# Patient Record
Sex: Female | Born: 1985 | Race: Black or African American | Hispanic: No | Marital: Single | Smoking: Never smoker
Health system: Southern US, Community
[De-identification: ages and names within clinical notes are randomized; demographics above are authoritative.]

## PROBLEM LIST (undated history)

## (undated) DIAGNOSIS — D573 Sickle-cell trait: Secondary | ICD-10-CM

## (undated) DIAGNOSIS — E282 Polycystic ovarian syndrome: Secondary | ICD-10-CM

## (undated) DIAGNOSIS — E559 Vitamin D deficiency, unspecified: Secondary | ICD-10-CM

## (undated) DIAGNOSIS — N883 Incompetence of cervix uteri: Secondary | ICD-10-CM

## (undated) DIAGNOSIS — E669 Obesity, unspecified: Secondary | ICD-10-CM

## (undated) DIAGNOSIS — E079 Disorder of thyroid, unspecified: Secondary | ICD-10-CM

## (undated) HISTORY — DX: Sickle-cell trait: D57.3

## (undated) HISTORY — DX: Obesity, unspecified: E66.9

## (undated) HISTORY — DX: Vitamin D deficiency, unspecified: E55.9

## (undated) HISTORY — PX: WISDOM TOOTH EXTRACTION: SHX21

## (undated) HISTORY — DX: Polycystic ovarian syndrome: E28.2

---

## 2008-04-09 ENCOUNTER — Emergency Department (HOSPITAL_COMMUNITY): Admission: EM | Admit: 2008-04-09 | Discharge: 2008-04-09 | Payer: Self-pay | Admitting: Emergency Medicine

## 2008-09-08 ENCOUNTER — Emergency Department (HOSPITAL_COMMUNITY): Admission: EM | Admit: 2008-09-08 | Discharge: 2008-09-08 | Payer: Self-pay | Admitting: Emergency Medicine

## 2010-05-30 LAB — GLUCOSE, CAPILLARY: Glucose-Capillary: 85 mg/dL (ref 70–99)

## 2011-02-18 ENCOUNTER — Emergency Department (HOSPITAL_COMMUNITY)
Admission: EM | Admit: 2011-02-18 | Discharge: 2011-02-19 | Discharge status: Against Medical Advice | Payer: PRIVATE HEALTH INSURANCE | Attending: Emergency Medicine | Admitting: Emergency Medicine

## 2011-02-18 ENCOUNTER — Encounter: Payer: Self-pay | Admitting: Emergency Medicine

## 2011-02-18 ENCOUNTER — Other Ambulatory Visit: Payer: Self-pay

## 2011-02-18 DIAGNOSIS — R1012 Left upper quadrant pain: Secondary | ICD-10-CM | POA: Insufficient documentation

## 2011-02-18 LAB — POCT PREGNANCY, URINE: Preg Test, Ur: NEGATIVE

## 2011-02-18 LAB — URINALYSIS, ROUTINE W REFLEX MICROSCOPIC
Nitrite: NEGATIVE
Specific Gravity, Urine: 1.023 (ref 1.005–1.030)
Urobilinogen, UA: 0.2 mg/dL (ref 0.0–1.0)

## 2011-02-18 LAB — URINE MICROSCOPIC-ADD ON

## 2011-02-18 NOTE — ED Notes (Signed)
Pt st's she has had a cough for approx 1 week,  Started having left upper quad pain 3 days ago but is better now.  Pt also c/o migraine headache

## 2012-05-24 ENCOUNTER — Encounter (HOSPITAL_COMMUNITY): Payer: Self-pay | Admitting: Obstetrics and Gynecology

## 2012-05-28 ENCOUNTER — Encounter (HOSPITAL_COMMUNITY): Payer: Self-pay

## 2012-06-06 ENCOUNTER — Encounter (HOSPITAL_COMMUNITY): Payer: Self-pay | Admitting: *Deleted

## 2012-06-06 MED ORDER — SODIUM CHLORIDE 0.9 % IV SOLN
3.0000 g | INTRAVENOUS | Status: AC
  Administered 2012-06-07: 3 g via INTRAVENOUS
  Filled 2012-06-06: qty 3

## 2012-06-06 NOTE — H&P (Signed)
Staria Birkhead Kubly is a 27 y.o. female G45P0100 presenting forprophylactic cerclage placement given a h/o a prior fetal loss at 20 weeks.  The pt had only one prenatal visit with that pregnancy so it is hard to determine why she lost the baby, but she reports LOF, cramping and delivery within hours of reaching the hospital and arrived dilated.  Given the unclear etiology we discussed options for management and have elected cervical cerclage and 17-OH progesterone treatment weekly.  She has no other issues except for a low progesterone in the first trimester that was supplemented with vaginal progesterone suppositories.  History Past OB hx Fetal loss at 20 weeks  Bay was 11oz  Past Medical History  Diagnosis Date  . Migraine   . Incompetent cervix    Past Surgical History  Procedure Laterality Date  . Wisdom tooth extraction     Family History: family history is not on file. Social History:  reports that she has never smoked. She does not have any smokeless tobacco history on file. She reports that she does not drink alcohol or use illicit drugs.    ROS    There were no vitals taken for this visit. Exam Physical Exam  Constitutional: She appears well-developed and well-nourished.  Cardiovascular: Normal rate and regular rhythm.   Respiratory: Effort normal and breath sounds normal.  GI: Soft.  Genitourinary: Vagina normal.  Uterus gravid 14 weeks  Neurological: She is alert.  Psychiatric: She has a normal mood and affect. Her behavior is normal.    Prenatal labs: ABO, Rh:  B positive Antibody:  negative  Assessment/Plan: Pt is admitted for cerclage placement for h/o fetal loss and suspicious for incompetent cervix.  She has been counseled on the risks and benefits including bleeding, possible ROM and possible fetal losss.  She desires to proceed.  Oliver Pila 06/06/2012, 7:53 AM

## 2012-06-07 ENCOUNTER — Encounter (HOSPITAL_COMMUNITY): Payer: Self-pay | Admitting: Anesthesiology

## 2012-06-07 ENCOUNTER — Ambulatory Visit (HOSPITAL_COMMUNITY)
Admission: RE | Admit: 2012-06-07 | Discharge: 2012-06-07 | Disposition: A | Discharge status: Home / Self Care | Payer: PRIVATE HEALTH INSURANCE | Source: Ambulatory Visit | Attending: Obstetrics and Gynecology | Admitting: Obstetrics and Gynecology

## 2012-06-07 ENCOUNTER — Encounter (HOSPITAL_COMMUNITY): Payer: Self-pay | Admitting: *Deleted

## 2012-06-07 ENCOUNTER — Ambulatory Visit (HOSPITAL_COMMUNITY): Payer: PRIVATE HEALTH INSURANCE | Admitting: Anesthesiology

## 2012-06-07 ENCOUNTER — Encounter (HOSPITAL_COMMUNITY)
Admission: RE | Disposition: A | Discharge status: Home / Self Care | Payer: Self-pay | Source: Ambulatory Visit | Attending: Obstetrics and Gynecology

## 2012-06-07 DIAGNOSIS — O3430 Maternal care for cervical incompetence, unspecified trimester: Secondary | ICD-10-CM | POA: Insufficient documentation

## 2012-06-07 DIAGNOSIS — N883 Incompetence of cervix uteri: Secondary | ICD-10-CM

## 2012-06-07 HISTORY — DX: Incompetence of cervix uteri: N88.3

## 2012-06-07 HISTORY — PX: CERVICAL CERCLAGE: SHX1329

## 2012-06-07 LAB — CBC
MCH: 29.5 pg (ref 26.0–34.0)
MCV: 87.8 fL (ref 78.0–100.0)
Platelets: 239 10*3/uL (ref 150–400)
RDW: 12.8 % (ref 11.5–15.5)

## 2012-06-07 SURGERY — CERCLAGE, CERVIX, VAGINAL APPROACH
Anesthesia: Spinal | Site: Cervix | Wound class: Clean Contaminated

## 2012-06-07 MED ORDER — LACTATED RINGERS IV SOLN
INTRAVENOUS | Status: DC
  Administered 2012-06-07 (×2): via INTRAVENOUS

## 2012-06-07 MED ORDER — FENTANYL CITRATE 0.05 MG/ML IJ SOLN: 25.0000 ug | INTRAMUSCULAR | Status: DC | PRN

## 2012-06-07 MED ORDER — METOCLOPRAMIDE HCL 5 MG/ML IJ SOLN: 10.0000 mg | Freq: Once | INTRAMUSCULAR | Status: DC | PRN

## 2012-06-07 MED ORDER — LACTATED RINGERS IV SOLN: INTRAVENOUS | Status: DC

## 2012-06-07 MED ORDER — MEPERIDINE HCL 25 MG/ML IJ SOLN: 6.2500 mg | INTRAMUSCULAR | Status: DC | PRN

## 2012-06-07 MED ORDER — LIDOCAINE IN DEXTROSE 5-7.5 % IV SOLN
INTRAVENOUS | Status: DC | PRN
  Administered 2012-06-07: 50 mg via INTRATHECAL

## 2012-06-07 SURGICAL SUPPLY — 15 items
CATH ROBINSON RED A/P 16FR (CATHETERS) IMPLANT
CLOTH BEACON ORANGE TIMEOUT ST (SAFETY) ×2 IMPLANT
COUNTER NEEDLE 1200 MAGNETIC (NEEDLE) IMPLANT
GLOVE BIO SURGEON STRL SZ 6.5 (GLOVE) ×2 IMPLANT
GOWN STRL REIN XL XLG (GOWN DISPOSABLE) ×4 IMPLANT
NEEDLE MAYO .5 CIRCLE (NEEDLE) ×2 IMPLANT
PACK VAGINAL MINOR WOMEN LF (CUSTOM PROCEDURE TRAY) ×2 IMPLANT
PAD OB MATERNITY 4.3X12.25 (PERSONAL CARE ITEMS) ×2 IMPLANT
PAD PREP 24X48 CUFFED NSTRL (MISCELLANEOUS) ×2 IMPLANT
SUT POLYDEK 5 CE 75 36 (SUTURE) ×4 IMPLANT
SYR BULB IRRIGATION 50ML (SYRINGE) ×2 IMPLANT
TOWEL OR 17X24 6PK STRL BLUE (TOWEL DISPOSABLE) ×4 IMPLANT
TUBING NON-CON 1/4 X 20 CONN (TUBING) ×2 IMPLANT
WATER STERILE IRR 1000ML POUR (IV SOLUTION) ×2 IMPLANT
YANKAUER SUCT BULB TIP NO VENT (SUCTIONS) ×2 IMPLANT

## 2012-06-07 NOTE — Op Note (Signed)
Operative note  Preoperative diagnosis Prior loss at [redacted] weeks gestation suggestive of incompetent cervix Intrauterine gestation at 14+ weeks  Postoperative diagnosis Same  Procedure McDonald cerclage x2 (knots at 1:00)  Surgeon Dr. Huel Cote  Anesthesia Spinal  Fluids Estimated blood loss less than 50 cc Urine output 50 cc IV fluids 1500 cc  Findings The cervix appeared long and closed  There were no specimens  Procedure After informed consent was obtained patient was taken to the operating room where spinal anesthesia was obtained without difficulty. She was prepped and draped in the normal sterile fashion in the dorsal lithotomy position. A weighted speculum was placed within the vagina after an appropriate time out was performed. The cervix was identified and 2 cerclage is placed in a pursestring fashion with #1 Polydek suture, Mayo needle. The knots were secured at 1:00 position. At the conclusion of the procedure the cervix appeared closed and there was no active bleeding. The patient was then taken to the recovery room in good condition.

## 2012-06-07 NOTE — Anesthesia Procedure Notes (Signed)
Spinal  Patient location during procedure: OR Preanesthetic Checklist Completed: patient identified, site marked, surgical consent, pre-op evaluation, timeout performed, IV checked, risks and benefits discussed and monitors and equipment checked Spinal Block Patient position: sitting Prep: DuraPrep Patient monitoring: heart rate, cardiac monitor, continuous pulse ox and blood pressure Approach: midline Location: L3-4 Injection technique: single-shot Needle Needle type: Sprotte  Needle gauge: 24 G Needle length: 9 cm Assessment Sensory level: T8 Additional Notes Spinal Dose 50mg  Xylocaine

## 2012-06-07 NOTE — Progress Notes (Signed)
Patient ID: Julie Trujillo, female   DOB: 03/05/1985, 27 y.o.   MRN: [id number] Pt states no changes in H&P.  Brief exam WNL.  Ready to proceed.

## 2012-06-07 NOTE — Anesthesia Postprocedure Evaluation (Signed)
  Anesthesia Post-op Note  Patient: Julie Trujillo  Procedure(s) Performed: Procedure(s) with comments: CERCLAGE CERVICAL (N/A) - 1 hr OR time   Patient is awake, responsive, moving her legs, and has signs of resolution of her numbness. Pain and nausea are reasonably well controlled. Vital signs are stable and clinically acceptable. Oxygen saturation is clinically acceptable. There are no apparent anesthetic complications at this time. Patient is ready for discharge.

## 2012-06-07 NOTE — Transfer of Care (Signed)
Immediate Anesthesia Transfer of Care Note  Patient: Julie Trujillo  Procedure(s) Performed: Procedure(s) with comments: CERCLAGE CERVICAL (N/A) - 1 hr OR time  Patient Location: PACU  Anesthesia Type:Spinal  Level of Consciousness: awake  Airway & Oxygen Therapy: Patient Spontanous Breathing  Post-op Assessment: Report given to PACU RN  Post vital signs: Reviewed and stable  Complications: No apparent anesthesia complications

## 2012-06-07 NOTE — Anesthesia Preprocedure Evaluation (Addendum)
Anesthesia Evaluation  Patient identified by MRN, date of birth, ID band Patient awake    Reviewed: Allergy & Precautions, H&P , NPO status , Patient's Chart, lab work & pertinent test results  Airway Mallampati: III TM Distance: >3 FB Neck ROM: Full    Dental no notable dental hx. (+) Dental Advisory Given and Teeth Intact   Pulmonary neg pulmonary ROS,  breath sounds clear to auscultation  Pulmonary exam normal       Cardiovascular negative cardio ROS  Rhythm:Regular Rate:Normal     Neuro/Psych  Headaches, negative psych ROS   GI/Hepatic negative GI ROS, Neg liver ROS,   Endo/Other  Morbid obesity  Renal/GU negative Renal ROS  negative genitourinary   Musculoskeletal negative musculoskeletal ROS (+)   Abdominal (+) + obese,   Peds negative pediatric ROS (+)  Hematology negative hematology ROS (+)   Anesthesia Other Findings   Reproductive/Obstetrics (+) Pregnancy Incompetent Cervix                          Anesthesia Physical Anesthesia Plan  ASA: III  Anesthesia Plan: Spinal   Post-op Pain Management:    Induction:   Airway Management Planned: Natural Airway  Additional Equipment:   Intra-op Plan:   Post-operative Plan:   Informed Consent: I have reviewed the patients History and Physical, chart, labs and discussed the procedure including the risks, benefits and alternatives for the proposed anesthesia with the patient or authorized representative who has indicated his/her understanding and acceptance.   Dental advisory given  Plan Discussed with: CRNA, Anesthesiologist and Surgeon  Anesthesia Plan Comments:        Anesthesia Quick Evaluation

## 2012-06-07 NOTE — Brief Op Note (Signed)
06/07/2012  8:15 AM  PATIENT:  Julie Trujillo  27 y.o. female  PRE-OPERATIVE DIAGNOSIS:  abnormal pregnancy, 59320/ incompetent cervix  POST-OPERATIVE DIAGNOSIS:  abnormal pregnancy, 16109  PROCEDURE:  Procedure(s) with comments: CERCLAGE CERVICAL (N/A) - 1 hr OR time  SURGEON:  Surgeon(s) and Role:    * Oliver Pila, MD - Primary  ANESTHESIA:   spinal  EBL:  Total I/O In: 1500 [I.V.:1500] Out: 125 [Urine:100; Blood:25]  BLOOD ADMINISTERED:none  DRAINS: none   LOCAL MEDICATIONS USED:  NONE  SPECIMEN:  No Specimen  DISPOSITION OF SPECIMEN:  N/A  COUNTS:  YES  TOURNIQUET:  * No tourniquets in log *  DICTATION: .Dragon Dictation  PLAN OF CARE: Discharge to home after PACU  PATIENT DISPOSITION:  PACU - hemodynamically stable.

## 2012-06-07 NOTE — OR Nursing (Signed)
UP ON BEDSIDE IN OR WITH FEET DANGLING DOWN DR Senaida Ores STANDING IN FRONT ON PATIENT FOR SPINAL INDUCTION

## 2012-06-08 ENCOUNTER — Encounter (HOSPITAL_COMMUNITY): Payer: Self-pay | Admitting: Obstetrics and Gynecology

## 2012-07-09 ENCOUNTER — Other Ambulatory Visit: Payer: Self-pay

## 2012-07-11 ENCOUNTER — Other Ambulatory Visit (HOSPITAL_COMMUNITY): Payer: Self-pay | Admitting: Obstetrics and Gynecology

## 2012-07-11 DIAGNOSIS — O3432 Maternal care for cervical incompetence, second trimester: Secondary | ICD-10-CM

## 2012-07-12 ENCOUNTER — Encounter (HOSPITAL_COMMUNITY): Payer: Self-pay

## 2012-07-12 ENCOUNTER — Inpatient Hospital Stay (HOSPITAL_COMMUNITY)
Admission: AD | Admit: 2012-07-12 | Discharge: 2012-07-12 | Disposition: A | Discharge status: Home / Self Care | Payer: PRIVATE HEALTH INSURANCE | Source: Ambulatory Visit | Attending: Obstetrics and Gynecology | Admitting: Obstetrics and Gynecology

## 2012-07-12 ENCOUNTER — Encounter (HOSPITAL_COMMUNITY): Payer: Self-pay | Admitting: *Deleted

## 2012-07-12 ENCOUNTER — Ambulatory Visit (HOSPITAL_COMMUNITY)
Admission: RE | Admit: 2012-07-12 | Discharge: 2012-07-12 | Disposition: A | Discharge status: Home / Self Care | Payer: PRIVATE HEALTH INSURANCE | Source: Ambulatory Visit | Attending: Obstetrics and Gynecology | Admitting: Obstetrics and Gynecology

## 2012-07-12 DIAGNOSIS — O3432 Maternal care for cervical incompetence, second trimester: Secondary | ICD-10-CM

## 2012-07-12 DIAGNOSIS — O09299 Supervision of pregnancy with other poor reproductive or obstetric history, unspecified trimester: Secondary | ICD-10-CM | POA: Insufficient documentation

## 2012-07-12 DIAGNOSIS — N949 Unspecified condition associated with female genital organs and menstrual cycle: Secondary | ICD-10-CM | POA: Insufficient documentation

## 2012-07-12 DIAGNOSIS — O3430 Maternal care for cervical incompetence, unspecified trimester: Secondary | ICD-10-CM | POA: Insufficient documentation

## 2012-07-12 DIAGNOSIS — O4400 Placenta previa specified as without hemorrhage, unspecified trimester: Secondary | ICD-10-CM | POA: Insufficient documentation

## 2012-07-12 NOTE — MAU Note (Signed)
Patient states she had a cervical cerclage placed on 4-17. States she had an ultrasound today and had funneling and dilating of the cervix. Patient states she started having increasing vaginal discharge when she got home. Was instructed by Dr. Senaida Ores to come to be evaluated for possible transfer to [facility] for another cerclage. Denies pain.

## 2012-07-12 NOTE — MAU Note (Signed)
Assumed care of pt while waiting for transfer to [facility].

## 2012-07-12 NOTE — MAU Provider Note (Signed)
Ms. GLENDALE WHERRY is a 27 y.o. G2P0100 at [redacted]w[redacted]d who presents to MAU today with increased vaginal discharge. The patient had an [place] in MFM today that showed funneling of the cervix. The patient has a history of delivery at 18 weeks. She has a cerclage in place that does not appear to be holding well.   BP 111/66  Pulse 80  Temp(Src) 98.3 F (36.8 C) (Oral)  Resp 18  Ht 5' 5.5" (1.664 m)  Wt 136.986 kg (302 lb)  BMI 49.47 kg/m2  SpO2 99%  LMP 02/24/2012 GENERAL: Well-developed, well-nourished female in no acute distress.  HEENT: Normocephalic, atraumatic.   LUNGS: Effort normal HEART: Regular rate  SKIN: Warm, dry and without erythema PSYCH: Normal mood and affect  MDM Discussed patient with Dr. Senaida Ores. Per MFM patient needs to be transferred to [facility] to have another cerclage placed ASAP.   A: Incompetent cervix  P: Transfer patient to [facility] for evaluation by MFM and cerclage placement  Freddi Starr, PA-C 07/12/2012 8:51 PM

## 2012-07-13 ENCOUNTER — Ambulatory Visit (HOSPITAL_COMMUNITY): Payer: PRIVATE HEALTH INSURANCE

## 2012-07-25 ENCOUNTER — Inpatient Hospital Stay (HOSPITAL_COMMUNITY)
Admission: AD | Admit: 2012-07-25 | Discharge: 2012-08-01 | DRG: 775 | Disposition: A | Discharge status: Home / Self Care | Payer: PRIVATE HEALTH INSURANCE | Source: Ambulatory Visit | Attending: Obstetrics and Gynecology | Admitting: Obstetrics and Gynecology

## 2012-07-25 ENCOUNTER — Encounter (HOSPITAL_COMMUNITY): Payer: Self-pay | Admitting: *Deleted

## 2012-07-25 DIAGNOSIS — O3430 Maternal care for cervical incompetence, unspecified trimester: Principal | ICD-10-CM | POA: Diagnosis present

## 2012-07-25 DIAGNOSIS — O4290 Premature rupture of membranes, unspecified as to length of time between rupture and onset of labor, unspecified weeks of gestation: Secondary | ICD-10-CM | POA: Diagnosis present

## 2012-07-25 LAB — ABO/RH: ABO/RH(D): B POS

## 2012-07-25 MED ORDER — DOCUSATE SODIUM 100 MG PO CAPS
100.0000 mg | ORAL_CAPSULE | Freq: Every day | ORAL | Status: DC
  Administered 2012-07-26 – 2012-07-30 (×5): 100 mg via ORAL
  Filled 2012-07-25 (×5): qty 1

## 2012-07-25 MED ORDER — ACETAMINOPHEN 325 MG PO TABS
650.0000 mg | ORAL_TABLET | ORAL | Status: DC | PRN
  Administered 2012-07-25 – 2012-07-30 (×10): 650 mg via ORAL
  Filled 2012-07-25 (×10): qty 2

## 2012-07-25 MED ORDER — PRENATAL MULTIVITAMIN CH
1.0000 | ORAL_TABLET | Freq: Every day | ORAL | Status: DC
  Administered 2012-07-27 – 2012-07-30 (×4): 1 via ORAL
  Filled 2012-07-25 (×4): qty 1

## 2012-07-25 MED ORDER — GENTAMICIN SULFATE 40 MG/ML IJ SOLN
200.0000 mg | Freq: Three times a day (TID) | INTRAVENOUS | Status: DC
  Administered 2012-07-25 – 2012-07-27 (×5): 200 mg via INTRAVENOUS
  Filled 2012-07-25 (×6): qty 5

## 2012-07-25 MED ORDER — CALCIUM CARBONATE ANTACID 500 MG PO CHEW
2.0000 | CHEWABLE_TABLET | ORAL | Status: DC | PRN
  Administered 2012-07-28 – 2012-07-29 (×2): 400 mg via ORAL
  Filled 2012-07-25: qty 1
  Filled 2012-07-25: qty 2
  Filled 2012-07-25: qty 1

## 2012-07-25 MED ORDER — LACTATED RINGERS IV SOLN
INTRAVENOUS | Status: DC
  Administered 2012-07-25 – 2012-07-28 (×5): via INTRAVENOUS

## 2012-07-25 MED ORDER — ZOLPIDEM TARTRATE 5 MG PO TABS
5.0000 mg | ORAL_TABLET | Freq: Every evening | ORAL | Status: DC | PRN
  Administered 2012-07-25 – 2012-07-26 (×2): 5 mg via ORAL
  Filled 2012-07-25 (×2): qty 1

## 2012-07-25 MED ORDER — SODIUM CHLORIDE 0.9 % IV SOLN
2.0000 g | Freq: Four times a day (QID) | INTRAVENOUS | Status: DC
  Administered 2012-07-25 – 2012-07-27 (×7): 2 g via INTRAVENOUS
  Filled 2012-07-25 (×8): qty 2000

## 2012-07-25 MED ORDER — SODIUM CHLORIDE 0.9 % IJ SOLN
3.0000 mL | Freq: Two times a day (BID) | INTRAMUSCULAR | Status: DC
  Administered 2012-07-25 – 2012-07-30 (×3): 3 mL via INTRAVENOUS

## 2012-07-25 NOTE — H&P (Signed)
Julie Trujillo is a 27 y.o. female at 25 6/7 weeks (EDD 11/30/12 by LMP c/w 8 week [place]) presenting for finding of a failed cerclage with membranes present at external os and cervix dilated to 3 cm by [place])  Patient had a h/o fetal loss at 18 weeks with her first pregnancy and her story was concerning for incompetent cervix.  SHe had a cerclage placed this pregnancy at 14 weeks and cervix remained long and closed with suture intact at 17 weeks.  At 19 weeks and 5 days, the cervix was thought to be funneliing down to 1.5 cm and cerclage looked to be pulling out.  MFM was consulted and agreed and felt another attempt at rescue cerclage was warranted.  The patient was sent to [facility] but the cerclage delayed a week due to a suspected BV infection.  When she returned the next week, the cervix was worse by US exam and the membranes close to the stitch.  The patient is not an easy cerclage placement due to body habitus and a very long vaginal vault.  Dr. Gavin Potters felt attempt to replace the stitch at that point too risky.  The patient was d/c home on bedrest and pelvic rest.  She returned to the office today and was found to have the above findings.  Maternal Medical History:  Fetal activity: Perceived fetal activity is normal.      OB History   Grav Para Term Preterm Abortions TAB SAB Ect Mult Living   2 1 0 1 0 0 0 0 0 0     18-19 week loss NSVD    Past Medical History  Diagnosis Date  . Incompetent cervix    Past Surgical History  Procedure Laterality Date  . Wisdom tooth extraction    . Cervical cerclage N/A 06/07/2012    Procedure: CERCLAGE CERVICAL;  Surgeon: Oliver Pila, MD;  Location: [facility] ORS;  Service: Gynecology;  Laterality: N/A;  1 hr OR time   Family History: family history includes Cancer in her mother; Heart disease in her mother; and Hypertension in her mother. Social History:  reports that she has never smoked. She does not have any smokeless tobacco history on file. She reports that  she does not drink alcohol or use illicit drugs.   Prenatal Transfer Tool  Maternal Diabetes: No Genetic Screening: Normal Maternal Ultrasounds/Referrals: Abnormal:  Findings:   Other:Cervix shortened and dilated Fetal Ultrasounds or other Referrals:  None, Referred to Materal Fetal Medicine  Maternal Substance Abuse:  No Significant Maternal Medications:  None Significant Maternal Lab Results:  None Other Comments:  None  ROS    Blood pressure 123/86, pulse 99, temperature 98.3 F (36.8 C), temperature source Oral, resp. rate 18, last menstrual period 02/24/2012. Exam Physical Exam  Constitutional: She is oriented to person, place, and time. She appears well-developed and well-nourished.  Cardiovascular: Normal rate and regular rhythm.   Respiratory: Effort normal and breath sounds normal.  GI: Soft.  Genitourinary: Vagina normal.  Uterus gravid 22cm Cervix with bag palpable at os (by [place] dilated 3 cm)   Neurological: She is alert and oriented to person, place, and time.  Psychiatric: She has a normal mood and affect. Her behavior is normal.    Prenatal labs: ABO, Rh:  B positive Antibody:  negative Rubella:  Immune RPR:   NR HBsAg:   Neg HIV:   Neg GBS:    Quad screen negative Assessment/Plan: Long d/w pt re: poor prognosis for pregnancy now that  bag is exposed.  We discussed expectant management vs hospitalization on strict bedrest and she would like to try to save the pregnancy.  Will place on antibiotics for now given exposed membranes and seek any further input from MFM if pt remains stable overnight.    Oliver Pila 07/25/2012, 6:26 PM

## 2012-07-25 NOTE — Progress Notes (Signed)
ANTIBIOTIC CONSULT NOTE - INITIAL  Pharmacy Consult for Gentamicin Indication: Preterm labor/incompetent cervix  No Known Allergies  Patient Measurements: Height: 5\' 7"  (170.2 cm) Weight: 295 lb (133.811 kg) IBW/kg (Calculated) : 61.6 Adjusted Body Weight: 83.3kg  Vital Signs: Temp: 98.3 F (36.8 C) (06/04 1719) Temp src: Oral (06/04 1719) BP: 123/86 mmHg (06/04 1719) Pulse Rate: 99 (06/04 1719)  Labs: Estimated SCreainine= 0.7 with estimated CrCl > 110 ml/min  Microbiology: No results found for this or any previous visit (from the past 720 hour(s)).  Medical History: Past Medical History  Diagnosis Date  . Incompetent cervix     Medications:  Ampicillin 2 gram IV q6h Assessment: 26yo F admitted with incompetent cervix at 21+ weeks. Antibiotic therapy initiated due to exposed membranes per MD request.  Goal of Therapy:  Gentamicin peaks 6-23mcg/ml and trough < 13mcg/ml  Plan:  1. Gentamicin 200mg  IV q8h. 2. Check Creatinine to assess current renal function. 3. Will continue to follow and assess need for [facility] levels based on duration and pt's clinical status. Thanks!  Claybon Jabs 07/25/2012,6:59 PM

## 2012-07-26 LAB — CBC
HCT: 37.6 % (ref 36.0–46.0)
Hemoglobin: 12.5 g/dL (ref 12.0–15.0)
MCH: 29.6 pg (ref 26.0–34.0)
MCHC: 33.2 g/dL (ref 30.0–36.0)
MCV: 89.1 fL (ref 78.0–100.0)

## 2012-07-26 LAB — CREATININE, SERUM
GFR calc Af Amer: >90 mL/min (ref >90)
GFR calc non Af Amer: >90 mL/min (ref >90)

## 2012-07-26 NOTE — Progress Notes (Addendum)
Patient ID: Julie Trujillo, female   DOB: May 02, 1985, 27 y.o.   MRN: [id number] Pt admitted last pm 21 6/7 with incompetent cervix and failed cerclage  She reports no contractions or LOF  In trendelenberg strict bedrest On amp and gent for exposed membranes, unknown GBS  D/w pt we are just doing expectant management for now, knows that the baby cannot survive if she delivers this early, hoping to get closer to viability and will consider steroids Will ask MFM for any input.

## 2012-07-26 NOTE — Progress Notes (Signed)
SCr this morning is 0.76mg /dL.  Continue current gentamicin regimen.  Hurley Cisco, Pharm.D.

## 2012-07-26 NOTE — Progress Notes (Signed)
Antenatal Nutrition Assessment:  Currently  21 6/[redacted] weeks gestation, with incompetent cervix. Height  67" Weight 295 Lbs pre-pregnancy weight 340 Lbs.  BMI 46.3  IBW 135 Lbs   Total weight gain none, pt has experienced a 45 pound weight loss, and is likely losing lean body mass, not just fat mass. Is at high risk for malnutrition even given BMI. Weight gain goals 11-20 Lbs .   Estimated needs: 24-2600 kcal/day, 88-98 grams protein/day, 2.8 liters fluid/day  Regular diet tolerated well, appetite fair. Is a very picky eater. Tries to eat very small meals every 2 - 3 hours. No nausea, just taste and appetite changes with pregnancy. Changed diet order to antenatal regular, offered retail menu for additional options, allowed double protein portions at meals if requested. May need to supplement with Ensure BID if pt continues to eat 1 food at a meal and continues to lose weight.  Current diet prescription will provide for increased needs.  No abnormal nutrition related labs  Nutrition Dx: Increased nutrient needs r/t pregnancy and fetal growth requirements aeb [redacted] weeks gestation.  No educational needs assessed at this time.  Elisabeth Cara M.Odis Luster LDN Neonatal Nutrition Support Specialist Pager [phone]

## 2012-07-27 LAB — CBC
MCH: 29.6 pg (ref 26.0–34.0)
Platelets: 229 10*3/uL (ref 150–400)
RBC: 3.99 MIL/uL (ref 3.87–5.11)
WBC: 10.1 10*3/uL (ref 4.0–10.5)

## 2012-07-27 MED ORDER — SERTRALINE HCL 50 MG PO TABS
50.0000 mg | ORAL_TABLET | Freq: Every day | ORAL | Status: DC
  Administered 2012-07-27 – 2012-07-31 (×5): 50 mg via ORAL
  Filled 2012-07-27 (×5): qty 1

## 2012-07-27 MED ORDER — PIPERACILLIN-TAZOBACTAM 3.375 G IVPB
3.3750 g | Freq: Three times a day (TID) | INTRAVENOUS | Status: DC
  Administered 2012-07-27 – 2012-07-30 (×9): 3.375 g via INTRAVENOUS
  Filled 2012-07-27 (×12): qty 50

## 2012-07-27 NOTE — Progress Notes (Signed)
Patient ID: Julie Trujillo, female   DOB: [birth year], 27 y.o.   MRN: [id number] Pt admits to increasing anxiety and depression.  She has taken zoloft in the past and has helped.  We talked about risk vs benefits of this and she thinks she needs something.  Will start Zoloft 50mg  po qd.  Antibiotics changed to zosyn as per pharm recommendation for increased anaerobic coverage and ease of dosing.

## 2012-07-27 NOTE — Progress Notes (Signed)
Addalee is having a difficult time and is exhausted physically, emotionally and spiritually.  She has a history of loss at 18 weeks.  Being in this situation 5 years later is bringing up the pain of her previous loss.  She just recently sought support when she found out she was pregnant but has been in a state of fairly acute grief for the past 5 years and reports that she has not been able to sleep well during that time.  She continues to have difficulty sleeping and reported that the medication she was given here to help with that has only made her more restless.  She has a complicated relationship with FOB and only recently found out that he was married.     She was receptive to the idea of exploring her grief from her previous loss with chaplains next week, but she is aware that chaplains are available over the weekend if she would like to have that support sooner.    I helped her to think of some meditation and relaxation techniques and I offered prayer.   Please page as needs arise or as she requests.  [employer] Pager, [phone] 4:03 PM

## 2012-07-27 NOTE — Progress Notes (Signed)
Patient ID: Julie Trujillo, female   DOB: [birth year], 27 y.o.   MRN: [id number] HD #3  Incompetent cervix/failed cerclage 22 0/7 weeks  Pt stable on strict bedrest Received Delalutin at office day of admission, so will bring vial next week to continue Denies LOF or cramping, +FM  On Unasyn/gent for exposed membranes Dr. Sherrie George consulted yesterday and agrees with management Plan expectant management until 23 weeks then steroids and NICU consult. D/w pt preterm complications and possible need for c-section if can make it that far.

## 2012-07-27 NOTE — Progress Notes (Signed)
Spoke with Dr. Senaida Ores this morning about antibiotic coverage.  Julie Trujillo is currently on amp and gent with exposed membranes.  Recommended to change to Zosyn to provide broad spectrum coverage including anaerobes.  Zosyn has very similar coverage to Unasyn, but with better gram negative coverage and an every 8 hour dosing regimen.  We also infuse zosyn over a four hour period for more sustained drug levels without the need to monitor peaks/troughs.    Hurley Cisco, Pharm.D.

## 2012-07-28 LAB — CBC
HCT: 34.5 % — ABNORMAL LOW (ref 36.0–46.0)
Hemoglobin: 11.7 g/dL — ABNORMAL LOW (ref 12.0–15.0)
RDW: 12.8 % (ref 11.5–15.5)
WBC: 10.2 10*3/uL (ref 4.0–10.5)

## 2012-07-28 LAB — TYPE AND SCREEN: Antibody Screen: NEGATIVE

## 2012-07-28 MED ORDER — FAMOTIDINE 20 MG PO TABS
40.0000 mg | ORAL_TABLET | Freq: Two times a day (BID) | ORAL | Status: DC
  Administered 2012-07-29: 40 mg via ORAL
  Filled 2012-07-28 (×2): qty 1

## 2012-07-28 MED ORDER — HYDROXYPROGESTERONE CAPROATE 250 MG/ML IM OIL: 250.0000 mg | TOPICAL_OIL | Freq: Once | INTRAMUSCULAR | Status: DC

## 2012-07-28 NOTE — Progress Notes (Signed)
Patient ID: Julie Trujillo, female   DOB: [birth year], 27 y.o.   MRN: [id number] HD #4 Incompetent cervix/failed cerclage 22 1/7 weeks Pt stable on bedrest. Still reports poor sleep. No cramping or LOF. Plan Zosyn for 1 week. If no delivery will do betamethasone and [place] in one week. Delalutin on thursdays.

## 2012-07-29 LAB — CBC
Hemoglobin: 11.5 g/dL — ABNORMAL LOW (ref 12.0–15.0)
MCHC: 33.3 g/dL (ref 30.0–36.0)

## 2012-07-29 MED ORDER — GI COCKTAIL ~~LOC~~
30.0000 mL | Freq: Once | ORAL | Status: AC
  Administered 2012-07-29: 30 mL via ORAL
  Filled 2012-07-29: qty 30

## 2012-07-29 MED ORDER — PANTOPRAZOLE SODIUM 40 MG PO TBEC
40.0000 mg | DELAYED_RELEASE_TABLET | Freq: Every day | ORAL | Status: DC
  Administered 2012-07-29 – 2012-08-01 (×4): 40 mg via ORAL
  Filled 2012-07-29 (×5): qty 1

## 2012-07-29 NOTE — Progress Notes (Signed)
Patient C/O severe pain due to acid reflux. Reports Tums has not provided relief. Administered Pepcid. Candise Che, RN

## 2012-07-29 NOTE — Progress Notes (Signed)
Patient ID: Julie Trujillo, female   DOB: 1985-09-10, 27 y.o.   MRN: [id number] Pt stable but having terrible heartburn not relieved by pepcid 22 2/7 weeks incompetent cervix/ failed cerclage Pt still on Zosyn, Day 2 on amp and gent prior Temp at 99+ but WBC WNL

## 2012-07-30 LAB — CBC WITH DIFFERENTIAL/PLATELET
Eosinophils Relative: 0 % (ref 0–5)
HCT: 35.8 % — ABNORMAL LOW (ref 36.0–46.0)
Lymphocytes Relative: 4 % — ABNORMAL LOW (ref 12–46)
Lymphs Abs: 0.5 10*3/uL — ABNORMAL LOW (ref 0.7–4.0)
MCV: 86.9 fL (ref 78.0–100.0)
Monocytes Absolute: 0.8 10*3/uL (ref 0.1–1.0)
RBC: 4.12 MIL/uL (ref 3.87–5.11)
WBC: 13.7 10*3/uL — ABNORMAL HIGH (ref 4.0–10.5)

## 2012-07-30 LAB — AMNISURE RUPTURE OF MEMBRANE (ROM) NOT AT ARMC: Amnisure ROM: NEGATIVE

## 2012-07-30 MED ORDER — LACTATED RINGERS IV SOLN
INTRAVENOUS | Status: DC
  Administered 2012-07-30 – 2012-07-31 (×2): via INTRAVENOUS

## 2012-07-30 MED ORDER — DEXTROSE 5 % IV SOLN
500.0000 mg | INTRAVENOUS | Status: DC
  Administered 2012-07-30: 500 mg via INTRAVENOUS
  Filled 2012-07-30 (×2): qty 500

## 2012-07-30 MED ORDER — BUTORPHANOL TARTRATE 1 MG/ML IJ SOLN
1.0000 mg | Freq: Once | INTRAMUSCULAR | Status: AC
  Administered 2012-07-30: 1 mg via INTRAVENOUS
  Filled 2012-07-30: qty 1

## 2012-07-30 MED ORDER — DOCUSATE SODIUM 100 MG PO CAPS: 100.0000 mg | ORAL_CAPSULE | Freq: Every day | ORAL | Status: DC

## 2012-07-30 MED ORDER — BUTORPHANOL TARTRATE 1 MG/ML IJ SOLN
2.0000 mg | Freq: Once | INTRAMUSCULAR | Status: AC
  Administered 2012-07-30: 2 mg via INTRAVENOUS
  Filled 2012-07-30: qty 2

## 2012-07-30 MED ORDER — PRENATAL MULTIVITAMIN CH: 1.0000 | ORAL_TABLET | Freq: Every day | ORAL | Status: DC

## 2012-07-30 MED ORDER — SODIUM CHLORIDE 0.9 % IV SOLN
2.0000 g | Freq: Four times a day (QID) | INTRAVENOUS | Status: DC
  Administered 2012-07-30 – 2012-07-31 (×3): 2 g via INTRAVENOUS
  Filled 2012-07-30 (×7): qty 2000

## 2012-07-30 MED ORDER — ZOLPIDEM TARTRATE 5 MG PO TABS: 5.0000 mg | ORAL_TABLET | Freq: Every evening | ORAL | Status: DC | PRN

## 2012-07-30 MED ORDER — CALCIUM CARBONATE ANTACID 500 MG PO CHEW: 2.0000 | CHEWABLE_TABLET | ORAL | Status: DC | PRN

## 2012-07-30 MED ORDER — ACETAMINOPHEN 325 MG PO TABS: 650.0000 mg | ORAL_TABLET | ORAL | Status: DC | PRN

## 2012-07-30 MED ORDER — AMOXICILLIN 500 MG PO CAPS
500.0000 mg | ORAL_CAPSULE | Freq: Three times a day (TID) | ORAL | Status: DC
Start: 2012-08-01 — End: 2012-07-31

## 2012-07-30 MED ORDER — AZITHROMYCIN 500 MG PO TABS: 500.0000 mg | ORAL_TABLET | Freq: Every day | ORAL | Status: DC

## 2012-07-30 MED ORDER — SODIUM CHLORIDE 0.9 % IV BOLUS (SEPSIS)
500.0000 mL | Freq: Once | INTRAVENOUS | Status: AC
  Administered 2012-07-30: 500 mL via INTRAVENOUS

## 2012-07-30 NOTE — Progress Notes (Signed)
RN called to room with pt c/o pain in lower abd that is intermittent.  No VB or leaking fluid.

## 2012-07-30 NOTE — Progress Notes (Signed)
Patient ID: Julie Trujillo, female   DOB: Nov 18, 1985, 27 y.o.   MRN: [id number] 22 3/7 Incompetent Cervix and failed cerclage  Pt stable, slept well Plan steroids and [place] this Friday at 23 weeks if stable Continue Zosyn through end of week

## 2012-07-30 NOTE — Progress Notes (Addendum)
Patient ID: Julie Trujillo, female   DOB: 04-Mar-1985, 27 y.o.   MRN: [id number]   CTSP secondary to increasing cramping pain.  Pt states are "close" together.  Pt denies relief with Stadol, but sleepy.  Contractions aren't able to be palpated or picked up with toco.  Pt states 7-8/10, was 10+/10  Amnisure performed, read as negative  Sterile speculum exam, gush prior, large pool in vagina. Fern test - negative  No membranes seen,  Pool unable to be cleared,  End of stitch seen in fluid.  D/w MFM, Sherrie George. Will obtain US in AM with MFM to assess stitch and cervix.  Will change to Amp/erythro BMZ at 23 week D/w pt POC, voiced understanding.  When given options, desires to continue abx, trendelenberg, plan for BMZ at 23 week.  Will plan for NICU consult at 23 week  Pt denies pressure, no VB.   Also obtain CBC with diff  IUP at 22+

## 2012-07-30 NOTE — Progress Notes (Signed)
MD notified and to come and evaluate pt status

## 2012-07-31 ENCOUNTER — Encounter (HOSPITAL_COMMUNITY): Payer: Self-pay | Admitting: *Deleted

## 2012-07-31 ENCOUNTER — Inpatient Hospital Stay (HOSPITAL_COMMUNITY): Payer: PRIVATE HEALTH INSURANCE

## 2012-07-31 MED ORDER — BUTORPHANOL TARTRATE 1 MG/ML IJ SOLN: 2.0000 mg | Freq: Once | INTRAMUSCULAR | Status: AC

## 2012-07-31 MED ORDER — ONDANSETRON HCL 4 MG PO TABS: 4.0000 mg | ORAL_TABLET | ORAL | Status: DC | PRN

## 2012-07-31 MED ORDER — OXYCODONE-ACETAMINOPHEN 5-325 MG PO TABS
1.0000 | ORAL_TABLET | ORAL | Status: DC | PRN
  Administered 2012-07-31: 1 via ORAL
  Filled 2012-07-31: qty 1

## 2012-07-31 MED ORDER — BENZOCAINE-MENTHOL 20-0.5 % EX AERO: 1.0000 "application " | INHALATION_SPRAY | CUTANEOUS | Status: DC | PRN

## 2012-07-31 MED ORDER — PRENATAL MULTIVITAMIN CH
1.0000 | ORAL_TABLET | Freq: Every day | ORAL | Status: DC
  Administered 2012-07-31: 1 via ORAL

## 2012-07-31 MED ORDER — LACTATED RINGERS IV SOLN: INTRAVENOUS | Status: AC

## 2012-07-31 MED ORDER — DIPHENHYDRAMINE HCL 25 MG PO CAPS: 25.0000 mg | ORAL_CAPSULE | Freq: Four times a day (QID) | ORAL | Status: DC | PRN

## 2012-07-31 MED ORDER — OXYTOCIN 40 UNITS IN LACTATED RINGERS INFUSION - SIMPLE MED: 62.5000 mL/h | INTRAVENOUS | Status: AC | PRN

## 2012-07-31 MED ORDER — LANOLIN HYDROUS EX OINT: TOPICAL_OINTMENT | CUTANEOUS | Status: DC | PRN

## 2012-07-31 MED ORDER — MEASLES, MUMPS & RUBELLA VAC ~~LOC~~ INJ
0.5000 mL | INJECTION | Freq: Once | SUBCUTANEOUS | Status: DC
  Filled 2012-07-31: qty 0.5

## 2012-07-31 MED ORDER — BUTORPHANOL TARTRATE 1 MG/ML IJ SOLN: 1.0000 mg | Freq: Once | INTRAMUSCULAR | Status: DC

## 2012-07-31 MED ORDER — SENNOSIDES-DOCUSATE SODIUM 8.6-50 MG PO TABS
2.0000 | ORAL_TABLET | Freq: Every day | ORAL | Status: DC
  Administered 2012-07-31: 2 via ORAL

## 2012-07-31 MED ORDER — DIBUCAINE 1 % RE OINT: 1.0000 "application " | TOPICAL_OINTMENT | RECTAL | Status: DC | PRN

## 2012-07-31 MED ORDER — ZOLPIDEM TARTRATE 5 MG PO TABS: 5.0000 mg | ORAL_TABLET | Freq: Every evening | ORAL | Status: DC | PRN

## 2012-07-31 MED ORDER — BUTORPHANOL TARTRATE 1 MG/ML IJ SOLN
INTRAMUSCULAR | Status: AC
  Filled 2012-07-31: qty 1

## 2012-07-31 MED ORDER — BUTORPHANOL TARTRATE 1 MG/ML IJ SOLN
INTRAMUSCULAR | Status: AC
  Administered 2012-07-31: 2 mg via INTRAVENOUS
  Filled 2012-07-31: qty 2

## 2012-07-31 MED ORDER — BUTORPHANOL TARTRATE 1 MG/ML IJ SOLN
INTRAMUSCULAR | Status: AC
  Administered 2012-07-31: 1 mg via INTRAVENOUS
  Filled 2012-07-31: qty 1

## 2012-07-31 MED ORDER — TETANUS-DIPHTH-ACELL PERTUSSIS 5-2.5-18.5 LF-MCG/0.5 IM SUSP
0.5000 mL | Freq: Once | INTRAMUSCULAR | Status: AC
  Administered 2012-08-01: 0.5 mL via INTRAMUSCULAR
  Filled 2012-07-31: qty 0.5

## 2012-07-31 MED ORDER — ONDANSETRON HCL 4 MG/2ML IJ SOLN: 4.0000 mg | INTRAMUSCULAR | Status: DC | PRN

## 2012-07-31 MED ORDER — SIMETHICONE 80 MG PO CHEW: 80.0000 mg | CHEWABLE_TABLET | ORAL | Status: DC | PRN

## 2012-07-31 MED ORDER — OXYTOCIN 40 UNITS IN LACTATED RINGERS INFUSION - SIMPLE MED
INTRAVENOUS | Status: AC
  Filled 2012-07-31: qty 1000

## 2012-07-31 MED ORDER — BUTORPHANOL TARTRATE 1 MG/ML IJ SOLN: 1.0000 mg | Freq: Once | INTRAMUSCULAR | Status: AC

## 2012-07-31 MED ORDER — IBUPROFEN 600 MG PO TABS
600.0000 mg | ORAL_TABLET | Freq: Four times a day (QID) | ORAL | Status: DC
  Administered 2012-07-31 – 2012-08-01 (×4): 600 mg via ORAL
  Filled 2012-07-31 (×4): qty 1

## 2012-07-31 MED ORDER — PRENATAL MULTIVITAMIN CH
1.0000 | ORAL_TABLET | Freq: Every day | ORAL | Status: DC
  Administered 2012-08-01: 1 via ORAL
  Filled 2012-07-31 (×2): qty 1

## 2012-07-31 MED ORDER — WITCH HAZEL-GLYCERIN EX PADS: 1.0000 "application " | MEDICATED_PAD | CUTANEOUS | Status: DC | PRN

## 2012-07-31 NOTE — Progress Notes (Signed)
Patient ID: Julie Trujillo, female   DOB: 02/15/1986, 27 y.o.   MRN: [id number] Pt counseled re: poor US findings of anhydramnios and baby below cervix possibly. We discussed that the baby cannot survive at this point, and she understands and is ready to proceed with delivery. Examined and vertex is in the vagina with the cerclage probably intact behind it and holding baby in Will transfer to L&D and cut the suture to affect delivery.  Pt understands plan and is ready to proceed.

## 2012-07-31 NOTE — Consult Note (Signed)
Delivery Note:  Asked by Dr Ambrose Mantle to attend delivery to confirm gestation at birth. 22 4/7 wks, cerclage removal, PROM. SVD. Received fetus on the warmer. HR slow at 40/min. No respirations. Fetus is very foul smelling. Eyes are fused. L hand is swollen and red. Skin is gelatinous, very visible small vessels. No anomalies identified.  Impression: 22 weeks previable preterm                     BW: 446 gm  Plan: Comfort care.  I spoke to Ms Cosey and confirmed the gestation based on exam and that this is a previable gestation.  Terrelle Ruffolo Q

## 2012-07-31 NOTE — Progress Notes (Signed)
Patient ID: Julie Trujillo, female   DOB: [birth year], 27 y.o.   MRN: [id number] Delivery and cerclage removal note; THE PT HAD prom AND THE VERTEX WAS IN THE VAGINA SO THE PT WAS BROUGHT TO L&D She was placed in stirrups and the speculum was inserted I could see the vertex and the right hand was presenting beside the vertex. The presence of the hand made visualization of the sutures difficult. I was able to hold the hand out of the way with the ring forceps and I cut and removed 3 knots. The pt was then asked to push and with 2 sets of pushes she delivered a living female infant. The cord was clamped and the fetus was given to Dr. Mikle Bosworth who was in attendance. She observed the baby and reported to the mother that the infant was too early to survive. The placenta did not separate but some of it was in the vagina so a speculum was inserted and the placenta was grasped with ring forceps and after 2 cotyledons were removed the remainder of the placenta appeared to come out intact. The lower uterine segment was contracted so I could not treach the fundus but I felt no remaining producte. There were no lacerations EBL 400 cc's.

## 2012-07-31 NOTE — Progress Notes (Signed)
MD notified of preliminary report from U/S tech.  Fetal parts noted below the level of the cervix

## 2012-07-31 NOTE — Progress Notes (Signed)
Report to [employer] and pt transferred to 160 for cerclage removal attempt.

## 2012-07-31 NOTE — Progress Notes (Signed)
07/31/12 1100  Clinical Encounter Type  Visited With Health care provider (RN team)  Visit Type (premature delivery)  Referral From Chaplain;Nurse   Referred for follow-up support by Chaplain Dyanne Carrel and paged by Maris Berger RN team to provide support upon delivery.  Per Janalyn Shy, pt is not yet ready for Spiritual Care visit.    Please page as patient requests or as visit would be more timely.  Will follow up this afternoon.  [address] [place], [place] [phone]

## 2012-07-31 NOTE — Progress Notes (Addendum)
Pt tearful about the news from the MD but agrees with the POC.  Pt denies the need for anything at this time.  Contacting some friends and family.

## 2012-07-31 NOTE — Progress Notes (Signed)
Patient ID: Julie Trujillo, female   DOB: August 12, 1985, 27 y.o.   MRN: [id number]  CTSP secondary to increasing ctx pain.  Pt has had 2 doses of stadol, last at 8pm, states helps pain.  Also has started having VB, started as some streaking on toilet tissue.  Now increasing, urine is bloody.  Pt describes as cramping, coming and going - low pelvic pain.  Denies pressure in vagina.    AFVSS Tm = 99.7 gen NAD, trendelenberg Abd soft, FNT  SSE = dark clot, removed with sterile Fox swabs.  Cervix able to be viewed appears apx 3cm dilated. Thought not to be a change.  Part of stitch seen, thought not to be pulling through.  No active VB.  Membranes flush with cervix.  No parts in vagina.  (long graves speculum used).  From exam earlier, thought to be ruptured, no fluid in vagina though and membranes seen.    D/W pt situation, no need to change management at this time. Continue abx, trendelenberg, dose of stadol Advised pt to make sure RN/MD aware of changes. Pt voices understanding, states ready to be "done, " but wants to be sure to do "everything" she can.  Pt reassured.

## 2012-07-31 NOTE — Progress Notes (Signed)
Julie Trujillo  was seen today for an ultrasound appointment.  See full report in [facility].  Impression: Single IUP at 22 4/7 weeks Incompetent cervix s/p unsuccessful prophylactic cerclage Limited ultrasound performed Anhydramnios is appreciated consistent with PROM The fetus is cephalic - difficult to determine whether or not the fetal head had descended below the level of the cerclage stitch.  Ultrasound findings were discussed with [name] - she is aware of the guarded prognosis for the fetus given current gestational age.  Recommendations: Concur with plans to remove cerclage. Consider abdominal cerclage (ideally as an interval procedure) if the patient plans to have more children.  Alpha Gula, MD

## 2012-07-31 NOTE — Progress Notes (Signed)
07/31/12 1300  Clinical Encounter Type  Visited With Family (pt's mom)  Visit Type Spiritual support;Social support (bereavement support)  Spiritual Encounters  Spiritual Needs Emotional;Grief support   Met pt's mom in the hallway, introducing myself and offering spiritual care services.  Per mom, pt and family are coping fairly well right now, and she points out that pt may benefit from counseling later.  I mentioned the resources available through [facility] and will follow up with pt and family for further support and to ensure that they have Comfort Packet with print referral resources.  [address] [place], [place] [phone]

## 2012-07-31 NOTE — Progress Notes (Signed)
U/s tech at bedside 

## 2012-08-01 LAB — CBC
MCV: 88 fL (ref 78.0–100.0)
Platelets: 268 10*3/uL (ref 150–400)
RDW: 12.4 % (ref 11.5–15.5)
WBC: 8 10*3/uL (ref 4.0–10.5)

## 2012-08-01 MED ORDER — OXYCODONE-ACETAMINOPHEN 5-325 MG PO TABS: 1.0000 | ORAL_TABLET | Freq: Four times a day (QID) | ORAL | Status: DC | PRN

## 2012-08-01 MED ORDER — SERTRALINE HCL 50 MG PO TABS: 50.0000 mg | ORAL_TABLET | Freq: Every day | ORAL | Status: DC

## 2012-08-01 MED ORDER — IBUPROFEN 600 MG PO TABS: 600.0000 mg | ORAL_TABLET | Freq: Four times a day (QID) | ORAL | Status: DC | PRN

## 2012-08-01 NOTE — Progress Notes (Signed)
Patient ID: Julie Trujillo, female   DOB: Aug 20, 1985, 27 y.o.   MRN: [id number] #1 afebrile No heavy bleeding HGB stable. She complains of some cramping and tingling in her right calf when she walks. Her strength and reflexes appear normal in both legs. I advised her to call if it fals to improve or if she feels she will fall. She has a negative Homan's and there is no redness or swelling .

## 2012-08-01 NOTE — Progress Notes (Signed)
Pt ambulated out  Teaching complete  Emotional support continues

## 2012-08-02 NOTE — Discharge Summary (Signed)
Julie, Trujillo NO.:  [id number]  MEDICAL RECORD NO.:  [id number]  LOCATION:  9318                          FACILITY:  [facility]  PHYSICIAN:  Malachi Pro. Ambrose Mantle, M.D. DATE OF BIRTH:  1986-01-23  DATE OF ADMISSION:  07/25/2012 DATE OF DISCHARGE:  08/01/2012                              DISCHARGE SUMMARY   HISTORY OF PRESENT ILLNESS:  This is a 27 year old black female at 61- 6/7th weeks' gestation with [facility] of November 30, 2012, presented because of finding was of a failed cerclage with membranes present at the external os and the cervix dilated to 3 cm by ultrasound.  The patient had a history of fetal loss at 18 weeks with her first pregnancy, and her story was compatible with incompetent cervix.  She had a cerclage placed this pregnancy at 14 weeks and the cervix remained long and closed with the suture intact at 17 weeks.  At 19 weeks and 5 days, the cervix was thought to be funneling down to 1.5 cm and the cerclage looked to be pulling out.  Maternal Fetal Medicine was consulted and agreed and felt another attempt at rescue cerclage was warranted.  The patient was sent to [facility], but the cerclage delayed a week due to a suspected BV infection.  When she returned the next week, the cervix was worse by ultrasound exam and the membranes were close to the stitch. The patient was not an easy cerclage placement because of her body habitus and a very long vaginal vault.  Dr. Gavin Potters at [place] felt that attempt to replace the stitch at that point was too risky.  The patient was discharged home on bed rest and pelvic rest.  She returned to the office on the day of admission, and was found to have the above-noted findings.  The patient was admitted, placed in a Trendelenburg position, placed on antibiotics, and the plan was to give her steroids at 23 weeks.  On July 30, 2012, the patient began to have increasing cramping pain.  Dr. Ellyn Hack saw the patient.  There was a  gush of fluid in the vagina with a large pool that was nitrazine negative.  On July 31, 2012, the patient began to have some bleeding.  Sterile speculum exam showed a dark clot that was removed.  Cervix appeared to be 3-cm dilated.  No management change was implemented.  Dr. Senaida Ores saw the patient on July 31, 2012, advised the patient of findings at the ultrasound of anhydramnios and the baby appeared to be below the cervix.  HOSPITAL COURSE:  The patient was transferred to Labor and Delivery, where I assumed her care.I placed her in stirrups and removed the sutures. Subsequently, the patient pushed twice and delivered a living female infant.  Dr. Mikle Bosworth from Neonatology was present at delivery.  She informed the patient that the baby was too immature to survive.  On the first postpartum day, the patient states she is ready for discharge.  She does complain of some tingling in her left leg and cramping in her left calf.  I examined the patient neurologically and strength wise, she appears to be equal in both legs.  I advised the patient to call if the condition worsens or if she is afraid she will fall.  She seems to be able to walk well.  Her hemoglobin on admission was 12.2, hematocrit 35.8, white count 13,700, platelet count 238,000, followup hemoglobin 11.5.  FINAL DIAGNOSIS:  Intrauterine pregnancy at 22 weeks and 4 days, delivered, vertex, and right hand compound presentation.  OPERATION:  Removal of cerclage sutures, spontaneous delivery, no lacerations.  FINAL CONDITION:  Improved.  Instructions include our regular discharge instruction booklet and the after visit summary.  :  Prescriptions for Percocet 5/325 and Motrin 600 mg, 15 of each 1 every 6 hours as needed for pain and the patient had been started on Zoloft while she was in the hospital 50 mg a day.  This is to be continued.  She is advised to return to the office in 2 weeks for followup examination and birth  control will be discussed at that time.     Malachi Pro. Ambrose Mantle, M.D.     TFH/MEDQ  D:  08/01/2012  T:  08/02/2012  Job:  [id number]

## 2012-09-21 ENCOUNTER — Encounter (HOSPITAL_COMMUNITY): Payer: Self-pay | Admitting: Obstetrics and Gynecology

## 2012-09-26 ENCOUNTER — Encounter (HOSPITAL_COMMUNITY): Payer: Self-pay | Admitting: *Deleted

## 2012-09-26 ENCOUNTER — Encounter (HOSPITAL_COMMUNITY)
Admission: RE | Disposition: A | Discharge status: Home / Self Care | Payer: Self-pay | Source: Ambulatory Visit | Attending: Obstetrics and Gynecology

## 2012-09-26 ENCOUNTER — Ambulatory Visit (HOSPITAL_COMMUNITY)
Admission: RE | Admit: 2012-09-26 | Discharge: 2012-09-26 | Disposition: A | Discharge status: Home / Self Care | Payer: PRIVATE HEALTH INSURANCE | Source: Ambulatory Visit | Attending: Obstetrics and Gynecology | Admitting: Obstetrics and Gynecology

## 2012-09-26 ENCOUNTER — Ambulatory Visit (HOSPITAL_COMMUNITY): Payer: PRIVATE HEALTH INSURANCE

## 2012-09-26 ENCOUNTER — Ambulatory Visit (HOSPITAL_COMMUNITY): Payer: PRIVATE HEALTH INSURANCE | Admitting: Anesthesiology

## 2012-09-26 ENCOUNTER — Encounter (HOSPITAL_COMMUNITY): Payer: Self-pay | Admitting: Anesthesiology

## 2012-09-26 DIAGNOSIS — N883 Incompetence of cervix uteri: Secondary | ICD-10-CM | POA: Insufficient documentation

## 2012-09-26 DIAGNOSIS — N840 Polyp of corpus uteri: Secondary | ICD-10-CM | POA: Insufficient documentation

## 2012-09-26 DIAGNOSIS — N856 Intrauterine synechiae: Secondary | ICD-10-CM | POA: Insufficient documentation

## 2012-09-26 HISTORY — PX: CERCLAGE LAPAROSCOPIC ABDOMINAL: SHX5769

## 2012-09-26 HISTORY — PX: HYSTEROSCOPY: SHX211

## 2012-09-26 HISTORY — PX: LYSIS OF ADHESION: SHX5961

## 2012-09-26 LAB — CBC
Hemoglobin: 13.3 g/dL (ref 12.0–15.0)
MCH: 29.5 pg (ref 26.0–34.0)
MCHC: 32.9 g/dL (ref 30.0–36.0)
MCV: 89.6 fL (ref 78.0–100.0)

## 2012-09-26 SURGERY — HYSTEROSCOPY
Anesthesia: General | Site: Vagina | Wound class: Clean Contaminated

## 2012-09-26 MED ORDER — KETOROLAC TROMETHAMINE 30 MG/ML IJ SOLN: 15.0000 mg | Freq: Once | INTRAMUSCULAR | Status: DC | PRN

## 2012-09-26 MED ORDER — NEOSTIGMINE METHYLSULFATE 1 MG/ML IJ SOLN
INTRAMUSCULAR | Status: DC | PRN
  Administered 2012-09-26: 1 mg via INTRAVENOUS

## 2012-09-26 MED ORDER — MIDAZOLAM HCL 2 MG/2ML IJ SOLN
INTRAMUSCULAR | Status: AC
  Filled 2012-09-26: qty 2

## 2012-09-26 MED ORDER — INDIGOTINDISULFONATE SODIUM 8 MG/ML IJ SOLN
INTRAMUSCULAR | Status: AC
  Filled 2012-09-26: qty 5

## 2012-09-26 MED ORDER — HYDROCODONE-ACETAMINOPHEN 5-300 MG PO TABS: 1.0000 | ORAL_TABLET | Freq: Four times a day (QID) | ORAL | Status: DC | PRN

## 2012-09-26 MED ORDER — PROPOFOL 10 MG/ML IV EMUL
INTRAVENOUS | Status: AC
Start: 2012-09-26 — End: 2012-09-26
  Filled 2012-09-26: qty 20

## 2012-09-26 MED ORDER — KETOROLAC TROMETHAMINE 30 MG/ML IJ SOLN
INTRAMUSCULAR | Status: AC
Start: 2012-09-26 — End: 2012-09-26
  Filled 2012-09-26: qty 1

## 2012-09-26 MED ORDER — PROPOFOL 10 MG/ML IV BOLUS
INTRAVENOUS | Status: DC | PRN
  Administered 2012-09-26: 180 mg via INTRAVENOUS
  Administered 2012-09-26: 20 mg via INTRAVENOUS

## 2012-09-26 MED ORDER — LIDOCAINE HCL (CARDIAC) 20 MG/ML IV SOLN
INTRAVENOUS | Status: DC | PRN
  Administered 2012-09-26: 50 mg via INTRAVENOUS

## 2012-09-26 MED ORDER — VASOPRESSIN 20 UNIT/ML IJ SOLN
INTRAVENOUS | Status: DC | PRN
  Administered 2012-09-26: 16:00:00 via INTRAMUSCULAR

## 2012-09-26 MED ORDER — FENTANYL CITRATE 0.05 MG/ML IJ SOLN
INTRAMUSCULAR | Status: AC
  Filled 2012-09-26: qty 2

## 2012-09-26 MED ORDER — ONDANSETRON HCL 4 MG/2ML IJ SOLN
INTRAMUSCULAR | Status: DC | PRN
  Administered 2012-09-26: 4 mg via INTRAVENOUS

## 2012-09-26 MED ORDER — LIDOCAINE HCL (CARDIAC) 20 MG/ML IV SOLN
INTRAVENOUS | Status: AC
  Filled 2012-09-26: qty 5

## 2012-09-26 MED ORDER — ONDANSETRON HCL 4 MG/2ML IJ SOLN
INTRAMUSCULAR | Status: AC
  Filled 2012-09-26: qty 2

## 2012-09-26 MED ORDER — GLYCOPYRROLATE 0.2 MG/ML IJ SOLN
INTRAMUSCULAR | Status: DC | PRN
  Administered 2012-09-26 (×2): 0.2 mg via INTRAVENOUS

## 2012-09-26 MED ORDER — CEFAZOLIN SODIUM-DEXTROSE 2-3 GM-% IV SOLR
INTRAVENOUS | Status: DC | PRN
  Administered 2012-09-26: 2 g via INTRAVENOUS

## 2012-09-26 MED ORDER — GLYCOPYRROLATE 0.2 MG/ML IJ SOLN
INTRAMUSCULAR | Status: AC
  Filled 2012-09-26: qty 1

## 2012-09-26 MED ORDER — ROCURONIUM BROMIDE 100 MG/10ML IV SOLN
INTRAVENOUS | Status: DC | PRN
  Administered 2012-09-26: 40 mg via INTRAVENOUS
  Administered 2012-09-26: 10 mg via INTRAVENOUS

## 2012-09-26 MED ORDER — KETOROLAC TROMETHAMINE 30 MG/ML IJ SOLN
INTRAMUSCULAR | Status: DC | PRN
  Administered 2012-09-26: 30 mg via INTRAVENOUS

## 2012-09-26 MED ORDER — DOXYCYCLINE HYCLATE 50 MG PO CAPS: 100.0000 mg | ORAL_CAPSULE | Freq: Two times a day (BID) | ORAL | Status: DC

## 2012-09-26 MED ORDER — LACTATED RINGERS IV SOLN
INTRAVENOUS | Status: DC
  Administered 2012-09-26 (×2): via INTRAVENOUS

## 2012-09-26 MED ORDER — BUPIVACAINE-EPINEPHRINE PF 0.25-1:200000 % IJ SOLN
INTRAMUSCULAR | Status: AC
Start: 2012-09-26 — End: 2012-09-26
  Filled 2012-09-26: qty 30

## 2012-09-26 MED ORDER — FENTANYL CITRATE 0.05 MG/ML IJ SOLN
INTRAMUSCULAR | Status: AC
  Administered 2012-09-26: 50 ug via INTRAVENOUS
  Filled 2012-09-26: qty 2

## 2012-09-26 MED ORDER — VASOPRESSIN 20 UNIT/ML IJ SOLN
INTRAMUSCULAR | Status: AC
  Filled 2012-09-26: qty 1

## 2012-09-26 MED ORDER — ROCURONIUM BROMIDE 50 MG/5ML IV SOLN
INTRAVENOUS | Status: AC
  Filled 2012-09-26: qty 1

## 2012-09-26 MED ORDER — FENTANYL CITRATE 0.05 MG/ML IJ SOLN: 25.0000 ug | INTRAMUSCULAR | Status: DC | PRN

## 2012-09-26 MED ORDER — MIDAZOLAM HCL 5 MG/5ML IJ SOLN
INTRAMUSCULAR | Status: DC | PRN
  Administered 2012-09-26: 2 mg via INTRAVENOUS

## 2012-09-26 MED ORDER — BUPIVACAINE-EPINEPHRINE 0.25% -1:200000 IJ SOLN
INTRAMUSCULAR | Status: DC | PRN
  Administered 2012-09-26: 10 mL

## 2012-09-26 MED ORDER — FENTANYL CITRATE 0.05 MG/ML IJ SOLN
INTRAMUSCULAR | Status: DC | PRN
  Administered 2012-09-26: 50 ug via INTRAVENOUS
  Administered 2012-09-26: 100 ug via INTRAVENOUS
  Administered 2012-09-26 (×3): 50 ug via INTRAVENOUS
  Administered 2012-09-26: 100 ug via INTRAVENOUS

## 2012-09-26 MED ORDER — LACTATED RINGERS IR SOLN
Status: DC | PRN
  Administered 2012-09-26: 3000 mL

## 2012-09-26 MED ORDER — NEOSTIGMINE METHYLSULFATE 1 MG/ML IJ SOLN
INTRAMUSCULAR | Status: AC
  Filled 2012-09-26: qty 1

## 2012-09-26 MED ORDER — CEFAZOLIN SODIUM-DEXTROSE 2-3 GM-% IV SOLR
INTRAVENOUS | Status: AC
  Filled 2012-09-26: qty 50

## 2012-09-26 MED ORDER — FENTANYL CITRATE 0.05 MG/ML IJ SOLN
INTRAMUSCULAR | Status: AC
Start: 2012-09-26 — End: 2012-09-26
  Filled 2012-09-26: qty 2

## 2012-09-26 MED ORDER — ESTRADIOL 2 MG PO TABS: 2.0000 mg | ORAL_TABLET | Freq: Two times a day (BID) | ORAL | Status: DC

## 2012-09-26 MED ORDER — DEXAMETHASONE SODIUM PHOSPHATE 10 MG/ML IJ SOLN
INTRAMUSCULAR | Status: DC | PRN
  Administered 2012-09-26: 10 mg via INTRAVENOUS

## 2012-09-26 MED ORDER — FENTANYL CITRATE 0.05 MG/ML IJ SOLN
INTRAMUSCULAR | Status: AC
  Filled 2012-09-26: qty 5

## 2012-09-26 MED ORDER — DEXAMETHASONE SODIUM PHOSPHATE 10 MG/ML IJ SOLN
INTRAMUSCULAR | Status: AC
Start: 2012-09-26 — End: 2012-09-26
  Filled 2012-09-26: qty 1

## 2012-09-26 SURGICAL SUPPLY — 56 items
CABLE HIGH FREQUENCY MONO STRZ (ELECTRODE) IMPLANT
CANISTER SUCTION 2500CC (MISCELLANEOUS) ×4 IMPLANT
CANNULA CURETTE W/SYR 6 (CANNULA) ×4 IMPLANT
CATH ROBINSON RED A/P 16FR (CATHETERS) ×4 IMPLANT
CLOTH BEACON ORANGE TIMEOUT ST (SAFETY) ×4 IMPLANT
CONTAINER PREFILL 10% NBF 60ML (FORM) ×8 IMPLANT
CORD ACTIVE DISPOSABLE (ELECTRODE)
CORD ELECTRO ACTIVE DISP (ELECTRODE) IMPLANT
DERMABOND ADVANCED (GAUZE/BANDAGES/DRESSINGS)
DERMABOND ADVANCED .7 DNX12 (GAUZE/BANDAGES/DRESSINGS) IMPLANT
DEVICE TROCAR PUNCTURE CLOSURE (ENDOMECHANICALS) ×4 IMPLANT
DRAPE C-ARM 42X72 X-RAY (DRAPES) IMPLANT
DRESSING TELFA 8X3 (GAUZE/BANDAGES/DRESSINGS) ×4 IMPLANT
ELECT LOOP GYNE PRO 24FR (CUTTING LOOP)
ELECT NEEDLE TIP 2.8 STRL (NEEDLE) IMPLANT
ELECT REM PT RETURN 9FT ADLT (ELECTROSURGICAL) ×4
ELECT VAPORTRODE GRVD BAR (ELECTRODE) IMPLANT
ELECTRODE LOOP GYNE PRO 24FR (CUTTING LOOP) IMPLANT
ELECTRODE REM PT RTRN 9FT ADLT (ELECTROSURGICAL) ×3 IMPLANT
ELECTRODE ROLLER VERSAPOINT (ELECTRODE) IMPLANT
ELECTRODE RT ANGLE VERSAPOINT (CUTTING LOOP) IMPLANT
EVACUATOR SMOKE 8.L (FILTER) IMPLANT
FORCEPS CUTTING 33CM 5MM (CUTTING FORCEPS) IMPLANT
GLOVE BIO SURGEON STRL SZ8 (GLOVE) ×4 IMPLANT
GLOVE BIOGEL PI IND STRL 8.5 (GLOVE) ×3 IMPLANT
GLOVE BIOGEL PI INDICATOR 8.5 (GLOVE) ×1
GOWN PREVENTION PLUS LG XLONG (DISPOSABLE) ×8 IMPLANT
GOWN STRL REIN XL XLG (GOWN DISPOSABLE) ×8 IMPLANT
LOOP ANGLED CUTTING 22FR (CUTTING LOOP) IMPLANT
MANIPULATOR UTERINE 4.5 ZUMI (MISCELLANEOUS) ×8 IMPLANT
NEEDLE INSUFFLATION 120MM (ENDOMECHANICALS) ×4 IMPLANT
PACK HYSTEROSCOPY LF (CUSTOM PROCEDURE TRAY) ×4 IMPLANT
PACK LAPAROSCOPY BASIN (CUSTOM PROCEDURE TRAY) ×4 IMPLANT
PAD OB MATERNITY 4.3X12.25 (PERSONAL CARE ITEMS) ×4 IMPLANT
PENCIL BUTTON HOLSTER BLD 10FT (ELECTRODE) IMPLANT
POUCH SPECIMEN RETRIEVAL 10MM (ENDOMECHANICALS) IMPLANT
SEPRAFILM MEMBRANE 5X6 (MISCELLANEOUS) IMPLANT
SET IRRIG TUBING LAPAROSCOPIC (IRRIGATION / IRRIGATOR) ×4 IMPLANT
STENT BALLN UTERINE 3CM 6FR (Stent) IMPLANT
STENT BALLN UTERINE 4CM 6FR (STENTS) IMPLANT
SUT MERSILENE 5MM BP 1 12 (SUTURE) ×4 IMPLANT
SUT PROLENE 0 CT 1 30 (SUTURE) IMPLANT
SUT SILK 2 0 FSL 18 (SUTURE) ×8 IMPLANT
SUT SILK 2 0 SH (SUTURE) IMPLANT
SUT VIC AB 2-0 UR6 27 (SUTURE) IMPLANT
SUT VICRYL 0 TIES 12 18 (SUTURE) IMPLANT
SYR 20CC LL (SYRINGE) IMPLANT
SYR 50ML LL SCALE MARK (SYRINGE) IMPLANT
SYR TOOMEY 50ML (SYRINGE) ×4 IMPLANT
TOWEL OR 17X24 6PK STRL BLUE (TOWEL DISPOSABLE) ×8 IMPLANT
TRAY FOLEY CATH 14FR (SET/KITS/TRAYS/PACK) IMPLANT
TROCAR OPTI TIP 5M 100M (ENDOMECHANICALS) ×4 IMPLANT
TROCAR XCEL DIL TIP R 11M (ENDOMECHANICALS) IMPLANT
TROCAR XCEL OPT SLVE 5M 100M (ENDOMECHANICALS) ×16 IMPLANT
WARMER LAPAROSCOPE (MISCELLANEOUS) ×4 IMPLANT
WATER STERILE IRR 1000ML POUR (IV SOLUTION) ×4 IMPLANT

## 2012-09-26 NOTE — Anesthesia Postprocedure Evaluation (Signed)
Anesthesia Post Note  Patient: Julie Trujillo  Procedure(s) Performed: Procedure(s) (LRB):  DILATATION AND CURRETTAGE  HYSTEROSCOPY  with loa uterine Stent placement (N/A) LYSIS OF ADHESION (N/A) LAPAROSCOPIC TRANS ABDOMINAL CERCLAGE  with intraoperative ultrasound  (N/A)  Anesthesia type: General  Patient location: PACU  Post pain: Pain level controlled  Post assessment: Post-op Vital signs reviewed  Last Vitals:  Filed Vitals:   09/26/12 1900  BP: 117/75  Pulse: 80  Temp: 37.4 C  Resp: 14    Post vital signs: Reviewed  Level of consciousness: sedated  Complications: No apparent anesthesia complications

## 2012-09-26 NOTE — Op Note (Signed)
OPERATIVE NOTE  Preoperative diagnosis: Cervical insufficiency, failure of transvaginal cerclage, intrauterine adhesions Postoperative diagnosis: Cervical insufficiency, failure of transvaginal cerclage, intrauterine adhesions, micro-polyps of the endometrium, pelvic adhesions Procedure: Hysteroscopy, lysis of intrauterine adhesions, suction D&C, insertion of uterine stent, laparoscopy, transabdominal cervico-isthmic cerclage, lysis of adhesions Anesthesia: Gen. Endotracheal Surgeon:Zlata Alcaide Complications: None  Estimated blood loss: Less than 20 cc Specimens: Uterine curettings to pathology  Findings: The cervix was somewhat patulous. On hysteroscopy there were 1-2 mm polyps covering the entire endometrium under magnification. Both tubal ostia were seen. There was a 0.5 cm divot at the fundus composed of fibrous irregular tissue, almost giving the appearance of a sub-septate uterus. Based on the trans-vaginal ultrasound findings these were regarded as the adhesions. On laparoscopy, there were perihepatic adhesions. The liver and gallbladder were otherwise normal. There was diffuse filmy adhesions covering the peritoneal surfaces of the uterus, anterior and posterior cul-de-sac. Notably the distal ends of the fallopian tubes were free of adhesions. In fact fimbria were rated as 4/5. The ovaries were slightly enlarged and devoid of corrugations, characteristic for polycystic ovaries. There were anterior and posterior cul-de-sac adhesion bands that were filmy.  Description of the procedure: The patient was placed in dorsal supine position. General endotracheal anesthesia was administered. 2 g of cefazolin were given intravenously for prophylaxis. She was lysed in lithotomy position. The abdomen and vagina and perineum were prepped inside manner. A Foley catheter was inserted after the patient was draped. First a 30 5 mm hysteroscope was inserted using saline as distention medium. The  above-mentioned findings were noted. Hysteroscopic scissors the fundal adhesion that was marginal in location was divided. Using a manual evacuation device with a 6 mm curette on it, an endometrial suction curettage was performed. Uterus sounded to 7.5 centimeters. Next laparoscopy was performed using preemptive anesthesia with 0.25% lidocaine with 1 and 200,000 epinephrine. A 5 mm umbilical incision was made and a Verress needle was inserted. Pneumoperitoneum was created with carbon dioxide. 2 lower abdominal 5 mm ports were placed under direct visualization and ancillary instruments were inserted through these ports. Using needle electrode on the 35 W cutting current, the bladder flap was developed and uterine vessels were identified. A suprapubic skin incision was made after preemptive anesthesia and a Endo Close ligature carrier was inserted into the pelvis. A puncture was made medial to the right uterine artery at the cervical isthmic level sliding off the uterine parenchyma, and at this point the uterus was anteverted exposing the tip and exiting into the posterior cul-de-sac at the level right above the insertion of uterosacral ligaments. A #5 Mersilene tape with a looped made out of silk each and had been placed in the pelvis. 1 of these looped ends was grasped with the Endo Close and brought to the anterior cul-de-sac. The same steps were Repeated on the left side in order to bring the other end of the Mersilene tape to the anterior cul-de-sac. At this point the Mersilene tape was tied with 5 throws of knots intracorporeally. The ends were trimmed 1 cm long. The posterior cul-de-sac and anterior cul-de-sac adhesions were also lysed since this was necessary to adequately perform the procedure. The instruments and suture count were correct. The gas was allowed to come out. The port sites were removed. The incisions were approximated with Dermabond. The surgeon at this point moved to the vagina and  performed a transvaginal ultrasound obtaining a functional cervix length of 3.1 cm with the AP dimension of the cerclage being  2.2 centimeters and transverse diameter being 2.9 cm. A 5 mL uterine stent was placed and it was deflated in order to serve as an adhesion barrier in the uterus. The patient tolerated the procedure well and was transferred to recovery room in satisfactory condition. She will take 2 mg tablets of estradiol twice a day and doxycycline 100 mg twice a day (until the stent is removed in 2 weeks) postoperatively. After 25 days of estradiol treatment she will add 10 mg of Provera daily for 5 days in order to induce a withdrawal bleeding.

## 2012-09-26 NOTE — Anesthesia Preprocedure Evaluation (Signed)
Anesthesia Evaluation  Patient identified by MRN, date of birth, ID band Patient awake    Reviewed: Allergy & Precautions, H&P , NPO status , Patient's Chart, lab work & pertinent test results, reviewed documented beta blocker date and time   History of Anesthesia Complications Negative for: history of anesthetic complications  Airway Mallampati: II TM Distance: >3 FB Neck ROM: full    Dental  (+) Teeth Intact   Pulmonary neg pulmonary ROS,  breath sounds clear to auscultation  Pulmonary exam normal       Cardiovascular Exercise Tolerance: Good negative cardio ROS  Rhythm:regular Rate:Normal     Neuro/Psych PSYCHIATRIC DISORDERS (anxiety/depression) negative neurological ROS     GI/Hepatic negative GI ROS, Neg liver ROS,   Endo/Other  Morbid obesity  Renal/GU negative Renal ROS  negative genitourinary   Musculoskeletal   Abdominal   Peds  Hematology negative hematology ROS (+)   Anesthesia Other Findings   Reproductive/Obstetrics H/o preterm delivery x2 at 67 and 21 weeks                           Anesthesia Physical Anesthesia Plan  ASA: III  Anesthesia Plan: General ETT   Post-op Pain Management:    Induction:   Airway Management Planned:   Additional Equipment:   Intra-op Plan:   Post-operative Plan:   Informed Consent: I have reviewed the patients History and Physical, chart, labs and discussed the procedure including the risks, benefits and alternatives for the proposed anesthesia with the patient or authorized representative who has indicated his/her understanding and acceptance.   Dental Advisory Given  Plan Discussed with: CRNA and Surgeon  Anesthesia Plan Comments:         Anesthesia Quick Evaluation

## 2012-09-26 NOTE — Transfer of Care (Signed)
Immediate Anesthesia Transfer of Care Note  Patient: Julie Trujillo  Procedure(s) Performed: Procedure(s):  DILATATION AND CURRETTAGE  HYSTEROSCOPY  with loa uterine Stent placement (N/A) LYSIS OF ADHESION (N/A) LAPAROSCOPIC TRANS ABDOMINAL CERCLAGE  with intraoperative ultrasound  (N/A)  Patient Location: PACU  Anesthesia Type:General  Level of Consciousness: awake  Airway & Oxygen Therapy: Patient Spontanous Breathing  Post-op Assessment: Report given to PACU RN  Post vital signs: stable  Filed Vitals:   09/26/12 1243  BP: 124/66  Pulse: 91  Temp: 36.7 C  Resp: 18    Complications: No apparent anesthesia complications

## 2012-09-26 NOTE — H&P (Signed)
Julie Trujillo is an 27 y.o. female. with cx insufficiency which failed transvaginal cerclage. She also has thin intruterine adhesion.  Pertinent Gynecological History:    Menstrual History:  No LMP recorded. Patient has had an implant.    Past Medical History  Diagnosis Date  . Incompetent cervix     fetal demis at 22 wks- and one at 19 wks both delivered by svd  . Anxiety   . Depression     Past Surgical History  Procedure Laterality Date  . Wisdom tooth extraction    . Cervical cerclage N/A 06/07/2012    Procedure: CERCLAGE CERVICAL;  Surgeon: Oliver Pila, MD;  Location: [facility]S;  Service: Gynecology;  Laterality: N/A;  1 hr OR time    Family History  Problem Relation Age of Onset  . Cancer Mother   . Hypertension Mother   . Heart disease Mother     Social History:  reports that she has never smoked. She has never used smokeless tobacco. She reports that she does not drink alcohol or use illicit drugs.  Allergies: No Known Allergies  No prescriptions prior to admission    Review of Systems  Constitutional: Negative.   HENT: Negative.   Eyes: Negative.   Respiratory: Negative.   Cardiovascular: Negative.   Gastrointestinal: Negative.   Genitourinary: Negative.   Musculoskeletal: Negative.   Skin: Negative.   Neurological: Negative.   Endo/Heme/Allergies: Negative.   Psychiatric/Behavioral: Negative.     Blood pressure 124/66, pulse 91, temperature 98.1 F (36.7 C), temperature source Oral, resp. rate 18, height 5\' 7"  (1.702 m), weight 133.811 kg (295 lb), SpO2 100.00%, not currently breastfeeding. Physical Exam    Past Medical History  Diagnosis Date  . Incompetent cervix     fetal demis at 22 wks- and one at 19 wks both delivered by svd  . Anxiety   . Depression                     Past Surgical History  Procedure Laterality Date  . Wisdom tooth extraction    . Cervical cerclage N/A 06/07/2012    Procedure: CERCLAGE CERVICAL;  Surgeon:  Oliver Pila, MD;  Location: [facility]S;  Service: Gynecology;  Laterality: N/A;  1 hr OR time             Constitutional: She is oriented to person, place, and time. She appears well-developed and well-nourished.  HENT:  Head: Normocephalic and atraumatic.  Nose: Nose normal.  Mouth/Throat: Oropharynx is clear and moist. No oropharyngeal exudate.  Eyes: Conjunctivae normal and EOM are normal. Pupils are equal, round, and reactive to light. No scleral icterus.  Neck: Normal range of motion. Neck supple. No tracheal deviation present. No thyromegaly present.  Cardiovascular: Normal rate.   Respiratory: Effort normal and breath sounds normal.  GI: Soft. Bowel sounds are normal. She exhibits no distension and no mass. There is no tenderness.  Lymphadenopathy:    She has no cervical adenopathy.  Neurological: She is alert and oriented to person, place, and time. She has normal reflexes.  Skin: Skin is warm.  Psychiatric: She has a normal mood and affect. Her behavior is normal. Judgment and thought content normal.   Results for orders placed during the hospital encounter of 09/26/12 (from the past 24 hour(s))  PREGNANCY, URINE     Status: None   Collection Time    09/26/12  1:00 PM      Result Value Range  Preg Test, Ur NEGATIVE  NEGATIVE  CBC     Status: None   Collection Time    09/26/12  1:10 PM      Result Value Range   WBC 4.7  4.0 - 10.5 K/uL   RBC 4.51  3.87 - 5.11 MIL/uL   Hemoglobin 13.3  12.0 - 15.0 g/dL   HCT 16.1  09.6 - 04.5 %   MCV 89.6  78.0 - 100.0 fL   MCH 29.5  26.0 - 34.0 pg   MCHC 32.9  30.0 - 36.0 g/dL   RDW 40.9  81.1 - 91.4 %   Platelets 294  150 - 400 K/uL    No results found.  Imp: Cx insufficiency Asherman syndrome  Plan: H/S LOA, L/S, transabdominal cerclage  Mithcell Schumpert 09/26/2012, 2:01 PM

## 2012-09-27 ENCOUNTER — Encounter (HOSPITAL_COMMUNITY): Payer: Self-pay | Admitting: Obstetrics and Gynecology

## 2013-07-09 ENCOUNTER — Encounter: Payer: Self-pay | Admitting: Internal Medicine

## 2013-07-09 ENCOUNTER — Ambulatory Visit (INDEPENDENT_AMBULATORY_CARE_PROVIDER_SITE_OTHER): Payer: No Typology Code available for payment source | Admitting: Internal Medicine

## 2013-07-09 VITALS — BP 117/79 | HR 73 | Temp 98.9°F | Ht 67.5 in | Wt 329.2 lb

## 2013-07-09 DIAGNOSIS — R609 Edema, unspecified: Secondary | ICD-10-CM

## 2013-07-09 DIAGNOSIS — Z800 Family history of malignant neoplasm of digestive organs: Secondary | ICD-10-CM | POA: Insufficient documentation

## 2013-07-09 DIAGNOSIS — R600 Localized edema: Secondary | ICD-10-CM | POA: Insufficient documentation

## 2013-07-09 DIAGNOSIS — E669 Obesity, unspecified: Secondary | ICD-10-CM

## 2013-07-09 DIAGNOSIS — E282 Polycystic ovarian syndrome: Secondary | ICD-10-CM | POA: Insufficient documentation

## 2013-07-09 LAB — COMPLETE METABOLIC PANEL WITH GFR
ALBUMIN: 4.2 g/dL (ref 3.5–5.2)
ALT: 13 U/L (ref 0–35)
AST: 12 U/L (ref 0–37)
Alkaline Phosphatase: 62 U/L (ref 39–117)
BUN: 17 mg/dL (ref 6–23)
CALCIUM: 9.2 mg/dL (ref 8.4–10.5)
CHLORIDE: 105 meq/L (ref 96–112)
CO2: 26 meq/L (ref 19–32)
CREATININE: 1.08 mg/dL (ref 0.50–1.10)
GFR, EST AFRICAN AMERICAN: 82 mL/min
GFR, Est Non African American: 71 mL/min
GLUCOSE: 77 mg/dL (ref 70–99)
POTASSIUM: 3.9 meq/L (ref 3.5–5.3)
Sodium: 138 mEq/L (ref 135–145)
Total Bilirubin: 0.2 mg/dL (ref 0.2–1.2)
Total Protein: 7.1 g/dL (ref 6.0–8.3)

## 2013-07-09 LAB — CBC
HCT: 37.6 % (ref 36.0–46.0)
Hemoglobin: 12.2 g/dL (ref 12.0–15.0)
MCH: 28.8 pg (ref 26.0–34.0)
MCHC: 32.4 g/dL (ref 30.0–36.0)
MCV: 88.7 fL (ref 78.0–100.0)
PLATELETS: 321 10*3/uL (ref 150–400)
RBC: 4.24 MIL/uL (ref 3.87–5.11)
RDW: 13.3 % (ref 11.5–15.5)
WBC: 6.1 10*3/uL (ref 4.0–10.5)

## 2013-07-09 MED ORDER — FUROSEMIDE 20 MG PO TABS: 20.0000 mg | ORAL_TABLET | Freq: Every day | ORAL | Status: DC

## 2013-07-09 NOTE — Assessment & Plan Note (Signed)
The patient's bilateral edema, which improves when supine, is consistent with venous insufficiency, likely aggravated by obesity.  The patient has no murmur, dyspnea, orthopnea, or PND to suggest new CHF.  We considered the possibility of OSA leading to R-sided CHF, but pt denies symptoms.  We discussed treatment options, including compression stockings vs lasix (including discussions of the limitations of lasix).  At this time, the patient does not want compression stockings due to concerns about comfort, and would like to try lasix. -start lasix 20 mg daily -check CMP today -recheck BMET in 2-3 weeks

## 2013-07-09 NOTE — Progress Notes (Signed)
HPI The patient is a 28 y.o. female with no significant PMH, presents to our clinic to establish care.  The patient notes a 2-week history of bilateral leg swelling.  She notes that the swelling goes down after having legs elevated, and is worse when on her feet a lot.  She notes a mild "aching" pain in her ankles, 3/10 in severity, which accompanies the swelling.  The patient notes no dyspnea, orthopnea (sleeps with 2 pillows at baseline), PND, reported snoring, or daytime sleepiness.  No personal history of CHF, but mother has "heart problems".  No personal or FH of DVT's.  Chart review reveals an ED visit in 2010 noting the same complaint, with no specific treatment started at that time.  Past Medical History  Diagnosis Date  . Incompetent cervix     fetal demis at 22 wks- and one at 19 wks both delivered by svd  . PCOS (polycystic ovarian syndrome)   . Obesity    Allergies: None  Medications: None  FH:  Colon cancer - mom age 10142 Diabetes - mother's side Thyroid issues - mother  SH: Never smoker Alcohol: none Drug use  ROS: General: no fevers, chills, changes in weight, changes in appetite Skin: no rash HEENT: no blurry vision, hearing changes, sore throat Pulm: no dyspnea, coughing, wheezing CV: no chest pain, palpitations, shortness of breath Abd: no abdominal pain, nausea/vomiting, diarrhea/constipation GU: no dysuria, hematuria, polyuria Ext: no arthralgias, myalgias Neuro: no weakness, numbness, or tingling  Filed Vitals:   07/09/13 1409  BP: 117/79  Pulse: 73  Temp: 98.9 F (37.2 C)    PEX General: alert, cooperative, and in no apparent distress HEENT: pupils equal round and reactive to light, vision grossly intact, oropharynx clear and non-erythematous  Neck: supple, no JVD Lungs: clear to ascultation bilaterally, normal work of respiration, no wheezes, rales, ronchi Heart: regular rate and rhythm, no murmurs, gallops, or rubs Abdomen: soft, non-tender,  non-distended, normal bowel sounds Extremities: bilateral 1+ pitting edema to just above the ankles, no asymmetry, discoloration, or focal edema Neurologic: alert & oriented X3, cranial nerves II-XII intact, strength grossly intact, sensation intact to light touch  No current outpatient prescriptions on file prior to visit.   No current facility-administered medications on file prior to visit.    Assessment/Plan

## 2013-07-09 NOTE — Assessment & Plan Note (Signed)
Mother with history of colon cancer at age 27 (daughter does not know type of cancer, but notes no other family members with colon cancer).  Pt should be screened with colonoscopy at age 89.

## 2013-07-09 NOTE — Patient Instructions (Signed)
General Instructions: The swelling in your legs represents benign changes to the veins in your legs.  We will start treatment for this with Lasix, 20 mg, 1 tablet once per day. -please return for a follow-up visit in 2-3 weeks, to recheck labs, and ensure that this medication isn't making you too dehydrated.  If you are interested, make an [facility] at [facility] front [facility], to discuss diet and exercise.  Please return for a follow-up visit in 2-3 weeks  Progress Toward Treatment Goals:  No flowsheet data found.  Self Care Goals & Plans:  No flowsheet data found.  No flowsheet data found.   Care Management & Community Referrals:  No flowsheet data found.

## 2013-07-09 NOTE — Assessment & Plan Note (Signed)
The patient notes surprise at her weight.  She notes that she is currently trying to lose weight through dietary changes.  She is interested in meeting with Julie LeashDonna Trujillo at her next visit to discuss weight loss (diet/exercise). -referral to health coach

## 2013-07-11 NOTE — Progress Notes (Signed)
Case discussed with Dr. Brown soon after the resident saw the patient.  We reviewed the resident's history and exam and pertinent patient test results.  I agree with the assessment, diagnosis, and plan of care documented in the resident's note. 

## 2013-07-30 ENCOUNTER — Ambulatory Visit (INDEPENDENT_AMBULATORY_CARE_PROVIDER_SITE_OTHER): Payer: PRIVATE HEALTH INSURANCE | Admitting: Dietician

## 2013-07-30 ENCOUNTER — Encounter: Payer: Self-pay | Admitting: Internal Medicine

## 2013-07-30 ENCOUNTER — Ambulatory Visit (INDEPENDENT_AMBULATORY_CARE_PROVIDER_SITE_OTHER): Payer: PRIVATE HEALTH INSURANCE | Admitting: Internal Medicine

## 2013-07-30 ENCOUNTER — Encounter: Payer: Self-pay | Admitting: Dietician

## 2013-07-30 VITALS — Ht 67.5 in | Wt 328.4 lb

## 2013-07-30 VITALS — BP 119/75 | HR 69 | Temp 98.9°F | Ht 67.5 in | Wt 326.4 lb

## 2013-07-30 DIAGNOSIS — E282 Polycystic ovarian syndrome: Secondary | ICD-10-CM

## 2013-07-30 DIAGNOSIS — R609 Edema, unspecified: Secondary | ICD-10-CM

## 2013-07-30 DIAGNOSIS — Z0000 Encounter for general adult medical examination without abnormal findings: Secondary | ICD-10-CM

## 2013-07-30 DIAGNOSIS — R600 Localized edema: Secondary | ICD-10-CM

## 2013-07-30 DIAGNOSIS — E669 Obesity, unspecified: Secondary | ICD-10-CM

## 2013-07-30 MED ORDER — METFORMIN HCL 500 MG PO TABS: 500.0000 mg | ORAL_TABLET | Freq: Every day | ORAL | Status: DC

## 2013-07-30 NOTE — Assessment & Plan Note (Addendum)
Given that the patient has significant obesity and maybe new symptoms to suggest insulin resistance or progression towards diabetes. Will start metformin 500 mg daily for one month in followup -Hemoglobin A1c, lipid panel

## 2013-07-30 NOTE — Progress Notes (Signed)
   Subjective:    Patient ID: Julie Trujillo, female    DOB: Mar 24, 1985, 28 y.o.   MRN: [id number]  HPI Julie Trujillo is a 28 yo woman pmh as listed below here for LE edema recheck.  The patient was seen 07/09/13 for some LE edema most likely 2/2 venous insufficency in the setting of extreme obesity and the patient was started on lasix at that time. She reports that she has not been compliant with the medicine but not because of side effects. She stopped taking the medication because she felt it was not working fast enough. Her lower extremity edema is bilateral and usually worse after periods of long-standing, get better with elevation and when she is able to rest. It has not interfered with her job at [employer] as she is able to do some more sitting at this time. She has been having some odd episodes during the middle of the night where she has cravings for food and must eat before being able to go to bed and these episodes have only been happening within the last month. The patient states that she was on metformin previously for her PCO S. and does not remember when or why she stopped taking this medication. She has not been actively trying to lose weight but met with life coach today.   Past Medical History  Diagnosis Date  . Incompetent cervix     fetal demis at 22 wks- and one at 19 wks both delivered by svd  . PCOS (polycystic ovarian syndrome)   . Obesity    Current Outpatient Prescriptions on File Prior to Visit  Medication Sig Dispense Refill  . furosemide (LASIX) 20 MG tablet Take 1 tablet (20 mg total) by mouth daily.  30 tablet  2   No current facility-administered medications on file prior to visit.   Social, surgical, family history reviewed with patient and updated in appropriate chart locations.   Review of Systems  History obtained from chart review and the patient General ROS: negative for - chills, fatigue, fever, night sweats, weight gain or weight loss Endocrine ROS: negative  for - galactorrhea, hair pattern changes, hot flashes, palpitations or polydipsia/polyuria Cardiovascular ROS: no chest pain or dyspnea on exertion Genito-Urinary ROS: no dysuria, trouble voiding, or hematuria Dermatological ROS: negative for dry skin, eczema, lumps and rash     Objective:   Physical Exam Filed Vitals:   07/30/13 1509  BP: 119/75  Pulse: 69  Temp: 98.9 F (37.2 C)   General: sitting in chair, NAD, very obese HEENT: PERRL, EOMI, no scleral icterus Cardiac: RRR, no rubs, murmurs or gallops Pulm: clear to auscultation bilaterally, moving normal volumes of air Abd: soft, nontender, nondistended, BS present Ext: warm and well perfused, minimal to no pedal edema, many tattoos Neuro: alert and oriented X3, cranial nerves II-XII grossly intact     Assessment & Plan:  Please see problem oriented charting  Pt discussed with Dr. Stann Mainland

## 2013-07-30 NOTE — Progress Notes (Signed)
Case discussed with Dr. Sadek soon after the resident saw the patient.  We reviewed the resident's history and exam and pertinent patient test results.  I agree with the assessment, diagnosis, and plan of care documented in the resident's note. 

## 2013-07-30 NOTE — Patient Instructions (Signed)
General Instructions:   Please try to bring all your medicines next time. This will help [place] keep you safe from mistakes.   Progress Toward Treatment Goals:  No flowsheet data found.  Self Care Goals & Plans:  No flowsheet data found.  No flowsheet data found.   Care Management & Community Referrals:  No flowsheet data found.     Polycystic Ovarian Syndrome Polycystic ovarian syndrome (PCOS) is a common hormonal disorder among women of reproductive age. Most women with PCOS grow many small cysts on their ovaries. PCOS can cause problems with your periods and make it difficult to get pregnant. It can also cause an increased risk of miscarriage with pregnancy. If left untreated, PCOS can lead to serious health problems, such as diabetes and heart disease. CAUSES The cause of PCOS is not fully understood, but genetics may be a factor. SIGNS AND SYMPTOMS   Infrequent or no menstrual periods.   Inability to get pregnant (infertility) because of not ovulating.   Increased growth of hair on the face, chest, stomach, back, thumbs, thighs, or toes.   Acne, oily skin, or dandruff.   Pelvic pain.   Weight gain or obesity, usually carrying extra weight around the waist.   Type 2 diabetes.   High cholesterol.   High blood pressure.   Female-pattern baldness or thinning hair.   Patches of thickened and dark brown or black skin on the neck, arms, breasts, or thighs.   Tiny excess flaps of skin (skin tags) in the armpits or neck area.   Excessive snoring and having breathing stop at times while asleep (sleep apnea).   Deepening of the voice.   Gestational diabetes when pregnant.  DIAGNOSIS  There is no single test to diagnose PCOS.   Your health care provider will:   Take a medical history.   Perform a pelvic exam.   Have ultrasonography done.   Check your female and female hormone levels.   Measure glucose or sugar levels in the blood.   Do  other blood tests.   If you are producing too many female hormones, your health care provider will make sure it is from PCOS. At the physical exam, your health care provider will want to evaluate the areas of increased hair growth. Try to allow natural hair growth for a few days before the visit.   During a pelvic exam, the ovaries may be enlarged or swollen because of the increased number of small cysts. This can be seen more easily by using vaginal ultrasonography or screening to examine the ovaries and lining of the uterus (endometrium) for cysts. The uterine lining may become thicker if you have not been having a regular period.  TREATMENT  Because there is no cure for PCOS, it needs to be managed to prevent problems. Treatments are based on your symptoms. Treatment is also based on whether you want to have a baby or whether you need contraception.  Treatment may include:   Progesterone hormone to start a menstrual period.   Birth control pills to make you have regular menstrual periods.   Medicines to make you ovulate, if you want to get pregnant.   Medicines to control your insulin.   Medicine to control your blood pressure.   Medicine and diet to control your high cholesterol and triglycerides in your blood.  Medicine to reduce excessive hair growth.  Surgery, making small holes in the ovary, to decrease the amount of female hormone production. This is done through a  long, lighted tube (laparoscope) placed into the pelvis through a tiny incision in the lower abdomen.  HOME CARE INSTRUCTIONS  Only take over-the-counter or prescription medicine as directed by your health care provider.  Pay attention to the foods you eat and your activity levels. This can help reduce the effects of PCOS.  Keep your weight under control.  Eat foods that are low in carbohydrate and high in fiber.  Exercise regularly. SEEK MEDICAL CARE IF:  Your symptoms do not get better with  medicine.  You have new symptoms. Document Released: 06/03/2004 Document Revised: 11/28/2012 Document Reviewed: 07/26/2012 [facility] Patient Information 2014 [place], [place].

## 2013-07-30 NOTE — Progress Notes (Signed)
Patient Identified Concern: portion control  Stage of Change Patient Is In: prep/action Patient Reported Barriers:  Working different hours at work, changes in life (graduating from [employer]), not ready to calorie count or use online apps   Patient Reported Perceived Benefits:  need to assess at future visit  Patient Reports Self-Efficacy:  need to assess at future visit Behavior Change Supports:  Internet and need to assess at future visit Goals:  Weight management   Patient Education:  PCOS, Insulin resistance and how to keep a food record  Workday: wakes up around 6am B: water  Snk 10am: oreo cookies mini 8 to 9 cookies L 1p: baked meat (6-9oz), starch 1-11/2 cup (macaroni, white rice with butter, salt, & pepper) and canned vegetables (broccoli, green beans, corn, greens) with a can of soda or 8-12oz juice. D 7:30-8pm: 2c cereal (fruity pebbles or fruit loops) with 2% milk and fresh fruit Snk - fruit cup with water Bed around 10-11pm. Sometimes will get up in the middle of the night and can not go back to sleep without eating which includes portion of baked meat and water.  Non work days eats throughout the day with one balanced meal around 7:30-8pm Grits, donuts, cereal  Plan: patient to keep food diary 3-7 days, follow-up one week.

## 2013-07-30 NOTE — Assessment & Plan Note (Addendum)
This continually seems to be venous insufficiency in the setting of obesity. The patient remains to have benign physical exam findings. The patient prematurely discontinued Lasix. The patient is now motivated for weight loss at this time. The patient doesn't remember ever having DVTs and recent laproscopic surgery by OB didn't show any pelvic masses to indicate possible etiology. Lymphedema may also be a consideration if no improvement.  -Bmet checked -Continue Lasix 20 mg daily for one month -Continue life coach visits for weight loss and encouraged exercise and dietary modifications

## 2013-07-31 LAB — LIPID PANEL
CHOL/HDL RATIO: 3.7 ratio
Cholesterol: 185 mg/dL (ref 0–200)
HDL: 50 mg/dL (ref >39)
LDL CALC: 116 mg/dL — AB (ref 0–99)
TRIGLYCERIDES: 93 mg/dL (ref <150)
VLDL: 19 mg/dL (ref 0–40)

## 2013-07-31 LAB — BASIC METABOLIC PANEL
BUN: 14 mg/dL (ref 6–23)
CALCIUM: 9.5 mg/dL (ref 8.4–10.5)
CHLORIDE: 104 meq/L (ref 96–112)
CO2: 28 meq/L (ref 19–32)
CREATININE: 0.92 mg/dL (ref 0.50–1.10)
Glucose, Bld: 83 mg/dL (ref 70–99)
Potassium: 4 mEq/L (ref 3.5–5.3)
SODIUM: 139 meq/L (ref 135–145)

## 2013-07-31 LAB — HEMOGLOBIN A1C
HEMOGLOBIN A1C: 5.8 % — AB (ref <5.7)
Mean Plasma Glucose: 120 mg/dL — ABNORMAL HIGH (ref <117)

## 2013-08-06 ENCOUNTER — Encounter: Payer: No Typology Code available for payment source | Admitting: Dietician

## 2013-08-30 ENCOUNTER — Encounter: Payer: PRIVATE HEALTH INSURANCE | Admitting: Internal Medicine

## 2013-09-06 ENCOUNTER — Encounter: Payer: PRIVATE HEALTH INSURANCE | Admitting: Internal Medicine

## 2013-11-30 ENCOUNTER — Encounter (HOSPITAL_COMMUNITY): Payer: Self-pay | Admitting: Emergency Medicine

## 2013-11-30 ENCOUNTER — Emergency Department (HOSPITAL_COMMUNITY)
Admission: EM | Admit: 2013-11-30 | Discharge: 2013-11-30 | Disposition: A | Discharge status: Home / Self Care | Payer: No Typology Code available for payment source | Attending: Emergency Medicine | Admitting: Emergency Medicine

## 2013-11-30 DIAGNOSIS — E079 Disorder of thyroid, unspecified: Secondary | ICD-10-CM | POA: Diagnosis not present

## 2013-11-30 DIAGNOSIS — R110 Nausea: Secondary | ICD-10-CM | POA: Diagnosis not present

## 2013-11-30 DIAGNOSIS — E669 Obesity, unspecified: Secondary | ICD-10-CM | POA: Insufficient documentation

## 2013-11-30 DIAGNOSIS — R61 Generalized hyperhidrosis: Secondary | ICD-10-CM | POA: Diagnosis not present

## 2013-11-30 DIAGNOSIS — Z3202 Encounter for pregnancy test, result negative: Secondary | ICD-10-CM | POA: Diagnosis not present

## 2013-11-30 DIAGNOSIS — R197 Diarrhea, unspecified: Secondary | ICD-10-CM | POA: Diagnosis not present

## 2013-11-30 DIAGNOSIS — R6883 Chills (without fever): Secondary | ICD-10-CM | POA: Insufficient documentation

## 2013-11-30 DIAGNOSIS — Z8742 Personal history of other diseases of the female genital tract: Secondary | ICD-10-CM | POA: Insufficient documentation

## 2013-11-30 DIAGNOSIS — Z79899 Other long term (current) drug therapy: Secondary | ICD-10-CM | POA: Diagnosis not present

## 2013-11-30 HISTORY — DX: Disorder of thyroid, unspecified: E07.9

## 2013-11-30 LAB — COMPREHENSIVE METABOLIC PANEL
ALBUMIN: 4.1 g/dL (ref 3.5–5.2)
ALK PHOS: 62 U/L (ref 39–117)
ALT: 15 U/L (ref 0–35)
ANION GAP: 12 (ref 5–15)
AST: 14 U/L (ref 0–37)
BUN: 11 mg/dL (ref 6–23)
CALCIUM: 9.6 mg/dL (ref 8.4–10.5)
CO2: 24 mEq/L (ref 19–32)
CREATININE: 0.86 mg/dL (ref 0.50–1.10)
Chloride: 103 mEq/L (ref 96–112)
GFR calc non Af Amer: >90 mL/min (ref >90)
GLUCOSE: 83 mg/dL (ref 70–99)
Potassium: 4 mEq/L (ref 3.7–5.3)
Sodium: 139 mEq/L (ref 137–147)
TOTAL PROTEIN: 7.8 g/dL (ref 6.0–8.3)
Total Bilirubin: 0.2 mg/dL — ABNORMAL LOW (ref 0.3–1.2)

## 2013-11-30 LAB — CBC WITH DIFFERENTIAL/PLATELET
BASOS PCT: 0 % (ref 0–1)
Basophils Absolute: 0 10*3/uL (ref 0.0–0.1)
EOS ABS: 0.1 10*3/uL (ref 0.0–0.7)
EOS PCT: 2 % (ref 0–5)
HCT: 37.1 % (ref 36.0–46.0)
HEMOGLOBIN: 12.2 g/dL (ref 12.0–15.0)
LYMPHS ABS: 1.9 10*3/uL (ref 0.7–4.0)
Lymphocytes Relative: 41 % (ref 12–46)
MCH: 28.9 pg (ref 26.0–34.0)
MCHC: 32.9 g/dL (ref 30.0–36.0)
MCV: 87.9 fL (ref 78.0–100.0)
MONOS PCT: 8 % (ref 3–12)
Monocytes Absolute: 0.4 10*3/uL (ref 0.1–1.0)
NEUTROS PCT: 49 % (ref 43–77)
Neutro Abs: 2.3 10*3/uL (ref 1.7–7.7)
Platelets: 330 10*3/uL (ref 150–400)
RBC: 4.22 MIL/uL (ref 3.87–5.11)
RDW: 13.3 % (ref 11.5–15.5)
WBC: 4.7 10*3/uL (ref 4.0–10.5)

## 2013-11-30 LAB — URINALYSIS, ROUTINE W REFLEX MICROSCOPIC
BILIRUBIN URINE: NEGATIVE
Glucose, UA: NEGATIVE mg/dL
Hgb urine dipstick: NEGATIVE
Ketones, ur: NEGATIVE mg/dL
LEUKOCYTES UA: NEGATIVE
NITRITE: NEGATIVE
PH: 5 (ref 5.0–8.0)
Protein, ur: NEGATIVE mg/dL
SPECIFIC GRAVITY, URINE: 1.038 — AB (ref 1.005–1.030)
UROBILINOGEN UA: 0.2 mg/dL (ref 0.0–1.0)

## 2013-11-30 LAB — TSH: TSH: 3.98 u[IU]/mL (ref 0.350–4.500)

## 2013-11-30 LAB — POC URINE PREG, ED: Preg Test, Ur: NEGATIVE

## 2013-11-30 MED ORDER — ONDANSETRON HCL 4 MG PO TABS: 4.0000 mg | ORAL_TABLET | Freq: Four times a day (QID) | ORAL | Status: DC

## 2013-11-30 MED ORDER — ONDANSETRON 4 MG PO TBDP
4.0000 mg | ORAL_TABLET | Freq: Once | ORAL | Status: AC
  Administered 2013-11-30: 4 mg via ORAL
  Filled 2013-11-30: qty 1

## 2013-11-30 NOTE — ED Provider Notes (Signed)
CSN: [id number]     Arrival date & time 11/30/13  1245 History   First MD Initiated Contact with Patient 11/30/13 1643     Chief Complaint  Patient presents with  . Nausea     (Consider location/radiation/quality/duration/timing/severity/associated sxs/prior Treatment) The history is provided by the patient and medical records.   This is a 28 y.o. F with PMH significant for PCOS, thyroid disease, presenting to the ED for nausea, alternating chills and diaphoresis for the past 2 days.  Patient states she has severe nausea but has not vomiting.  Denies fever.  No abdominal pain, urinary sx, vaginal complaints.  BM's have been more loose than normal.  Denies melena or hematochezia.  No cough, rhinorrhea, congestion, chest pain, SOB, palpitations, dizziness, weakness.  States she has hx of similar symptoms in the past but just ignored them.  Denies possibility of pregnancy.  States LMP was 11/07/13, cycles are usually regular.  VS stable on arrival.  Past Medical History  Diagnosis Date  . Incompetent cervix     fetal demis at 22 wks- and one at 19 wks both delivered by svd  . PCOS (polycystic ovarian syndrome)   . Obesity   . Thyroid disease    Past Surgical History  Procedure Laterality Date  . Wisdom tooth extraction    . Cervical cerclage N/A 06/07/2012    Procedure: CERCLAGE CERVICAL;  Surgeon: Oliver PilaKathy W Richardson, MD;  Location: [facility];  Service: Gynecology;  Laterality: N/A;  1 hr OR time  . Hysteroscopy N/A 09/26/2012    Procedure:  DILATATION AND CURRETTAGE  HYSTEROSCOPY  with loa uterine Stent placement;  Surgeon: Fermin Schwabamer Yalcinkaya, MD;  Location: [facility];  Service: Gynecology;  Laterality: N/A;  . Lysis of adhesion N/A 09/26/2012    Procedure: LYSIS OF ADHESION;  Surgeon: Fermin Schwabamer Yalcinkaya, MD;  Location: [facility];  Service: Gynecology;  Laterality: N/A;  . Cerclage laparoscopic abdominal N/A 09/26/2012    Procedure: LAPAROSCOPIC TRANS ABDOMINAL CERCLAGE  with intraoperative ultrasound ;   Surgeon: Fermin Schwabamer Yalcinkaya, MD;  Location: [facility];  Service: Gynecology;  Laterality: N/A;   Family History  Problem Relation Age of Onset  . Colon cancer Mother 5542  . Hypertension Mother   . Heart disease Mother   . Diabetes Maternal Aunt   . Thyroid disease Mother    History  Substance Use Topics  . Smoking status: Never Smoker   . Smokeless tobacco: Never Used  . Alcohol Use: No   OB History   Grav Para Term Preterm Abortions TAB SAB Ect Mult Living   2 2 0 2 0 0 0 0 0 1      Review of Systems  Constitutional: Positive for chills and diaphoresis.  Gastrointestinal: Positive for nausea.  All other systems reviewed and are negative.     Allergies  Review of patient's allergies indicates no known allergies.  Home Medications   Prior to Admission medications   Medication Sig Start Date End Date Taking? Authorizing Provider  furosemide (LASIX) 20 MG tablet Take 1 tablet (20 mg total) by mouth daily. 07/09/13 07/09/14  Linward Headlandyan K Brown, MD  metFORMIN (GLUCOPHAGE) 500 MG tablet Take 1 tablet (500 mg total) by mouth daily with breakfast. 07/30/13 07/30/14  Christen BameNora Sadek, MD   BP 124/83  Pulse 96  Temp(Src) 99.2 F (37.3 C) (Oral)  Resp 18  Ht 5\' 6"  (1.676 m)  Wt 370 lb (167.831 kg)  BMI 59.75 kg/m2  SpO2 98%  LMP 11/07/2013  Physical Exam  Nursing note and vitals reviewed. Constitutional: She is oriented to person, place, and time. Vital signs are normal. She appears well-developed and well-nourished. No distress.  Lying in bed smiling, NAD, no diaphoresis or rigors noted  HENT:  Head: Normocephalic and atraumatic.  Right Ear: Tympanic membrane and ear canal normal.  Left Ear: Tympanic membrane and ear canal normal.  Nose: Nose normal.  Mouth/Throat: Uvula is midline, oropharynx is clear and moist and mucous membranes are normal. No oropharyngeal exudate, posterior oropharyngeal edema, posterior oropharyngeal erythema or tonsillar abscesses.  Eyes: Conjunctivae and EOM are  normal. Pupils are equal, round, and reactive to light.  Neck: Normal range of motion. Neck supple.  Cardiovascular: Normal rate, regular rhythm and normal heart sounds.   Pulmonary/Chest: Effort normal and breath sounds normal. No respiratory distress. She has no wheezes.  Abdominal: Soft. Bowel sounds are normal. There is no tenderness. There is no rigidity, no rebound and no guarding.  Abdomen soft, nondistended, no focal tenderness or peritoneal signs  Musculoskeletal: Normal range of motion. She exhibits no edema.  Neurological: She is alert and oriented to person, place, and time.  Skin: Skin is warm and dry. She is not diaphoretic.  Psychiatric: She has a normal mood and affect.    ED Course  Procedures (including critical care time) Labs Review Labs Reviewed  COMPREHENSIVE METABOLIC PANEL - Abnormal; Notable for the following:    Total Bilirubin 0.2 (*)    All other components within normal limits  URINALYSIS, ROUTINE W REFLEX MICROSCOPIC - Abnormal; Notable for the following:    APPearance HAZY (*)    Specific Gravity, Urine 1.038 (*)    All other components within normal limits  CBC WITH DIFFERENTIAL  POC URINE PREG, ED    Imaging Review No results found.   EKG Interpretation None      MDM   Final diagnoses:  Nausea  Chills    28 year old female reporting nausea, loose stools, and intermittent diaphoresis and chills for the past 2 days.  On exam, patient afebrile Mepron nontoxic. She is not currently diaphoretic and no rigors present. Her exam is overall benign. Basic lab work was obtained which is well within normal limits. Urine pregnancy negative.  Patient does have hx of thyroid issues, states compliance with her synthroid.  Denies excessive fatigue, dizziness, lightheadedness.  TSH pending.  Do not feel she is in crisis/thyroid storm at this time as her is currently asymptomatic and her VS are stable.  Doubt atypical ACS. Encouraged close FU with PCP.  zofran  for symptomatic control.  Discussed plan with patient, he/she acknowledged understanding and agreed with plan of care.  Return precautions given for new or worsening symptoms.  Garlon HatchetLisa M Lyncoln Maskell, PA-C 11/30/13 2144

## 2013-11-30 NOTE — ED Notes (Signed)
Pt reports having nausea, excessive sweating and body shaking/chills x 2 days. No acute distress nted at triage.

## 2013-11-30 NOTE — ED Provider Notes (Signed)
Medical screening examination/treatment/procedure(s) were performed by non-physician practitioner and as supervising physician I was immediately available for consultation/collaboration.   EKG Interpretation None        Richardean Canalavid H Taiven Greenley, MD 11/30/13 2244

## 2013-11-30 NOTE — Discharge Instructions (Signed)
Take the prescribed medication as directed.  Your TSH level is pending at this time. Follow-up with your primary care physician for re-check. Return to the ED for new or worsening symptoms.

## 2013-12-23 ENCOUNTER — Encounter (HOSPITAL_COMMUNITY): Payer: Self-pay | Admitting: Emergency Medicine

## 2014-02-24 IMAGING — US US OB TRANSVAGINAL
1 series · 13 of 28 positions shown · non-contrast
Comparison: none

[Series 1: us ob transvaginal · 13 of 33 slices shown]
[im 2/33]
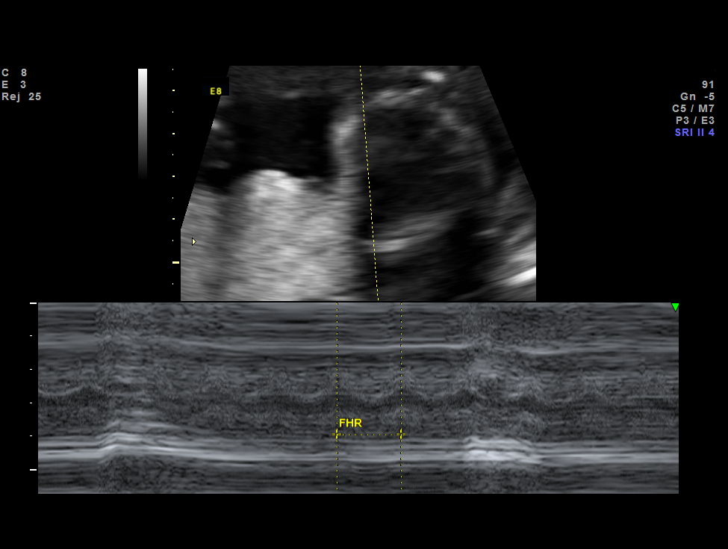
[im 4/33]
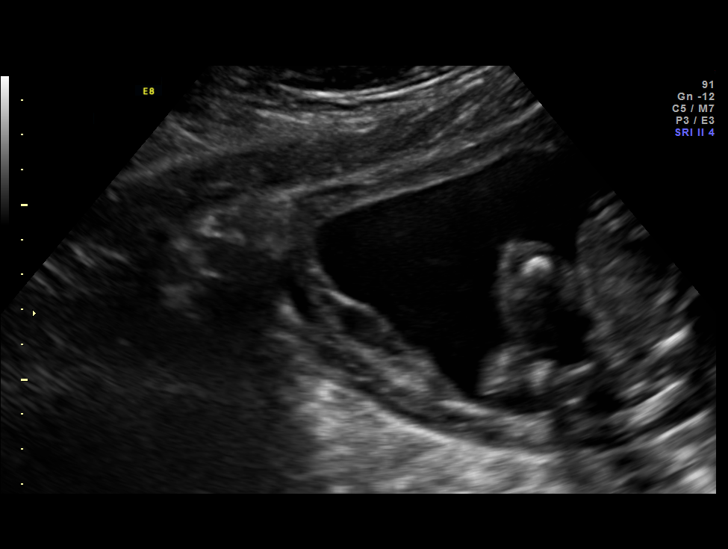
[im 6/33]
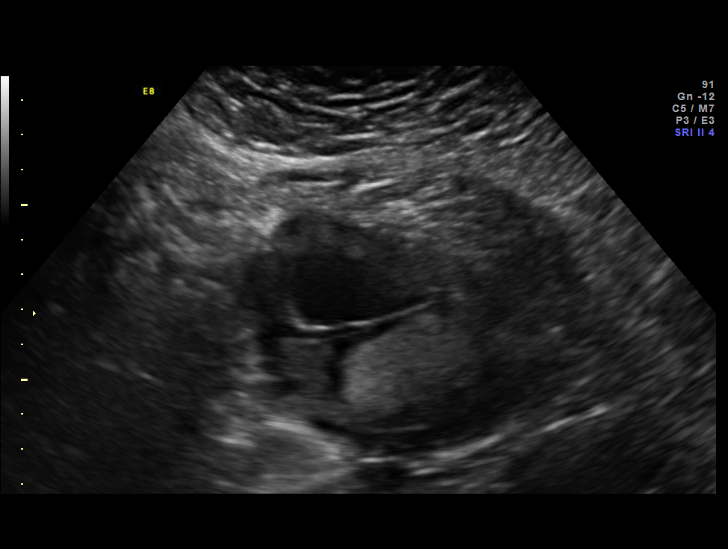
[im 9/33]
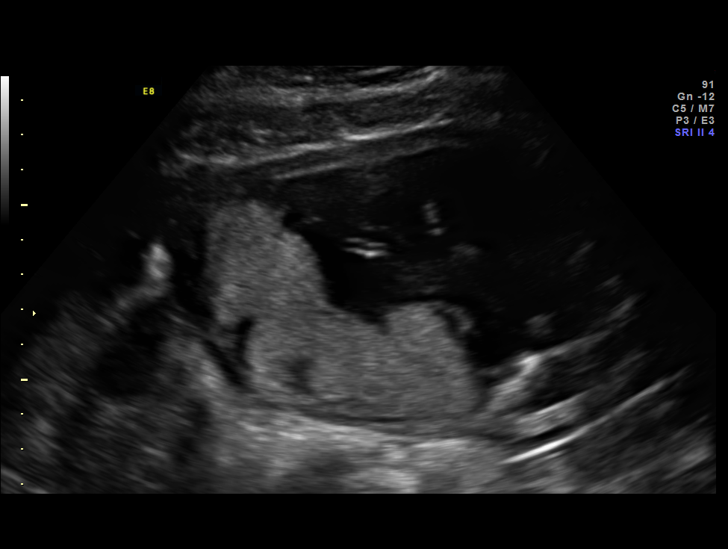
[im 11/33]
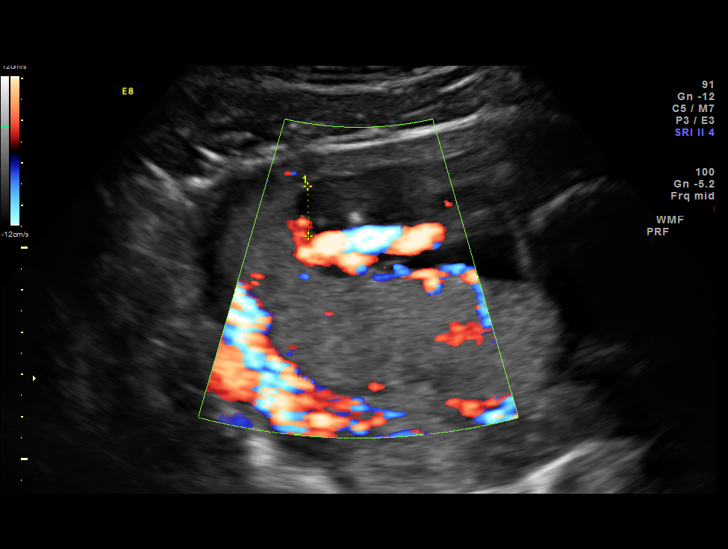
[im 14/33]
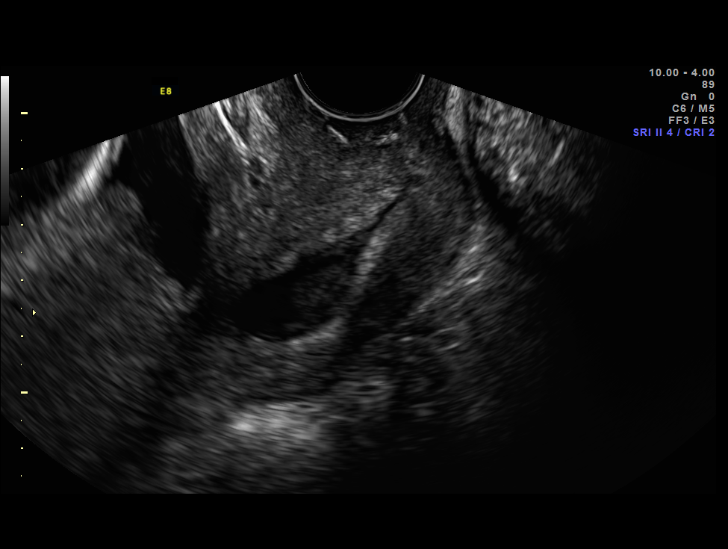
[im 17/33]
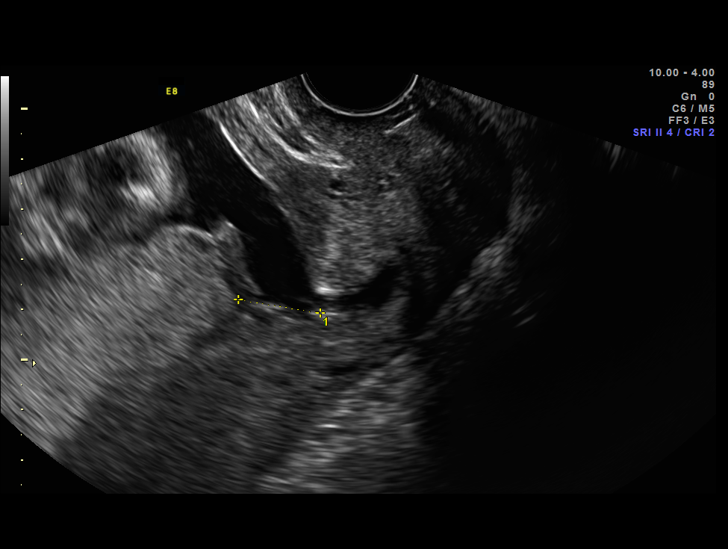
[im 19/33]
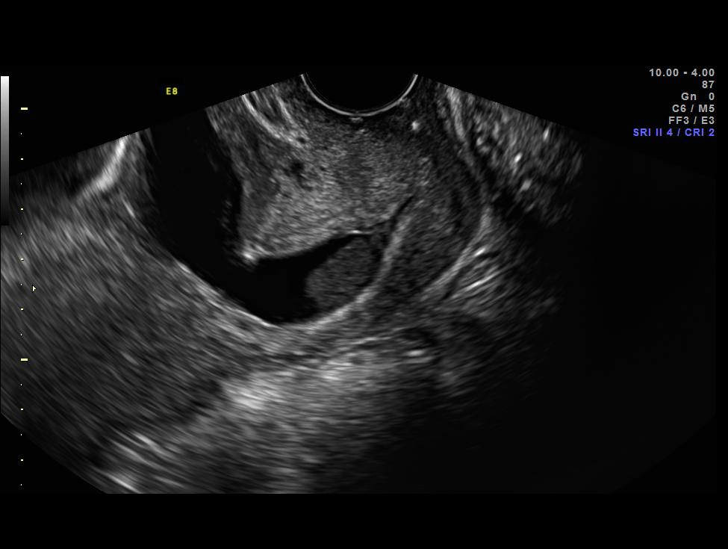
[im 22/33]
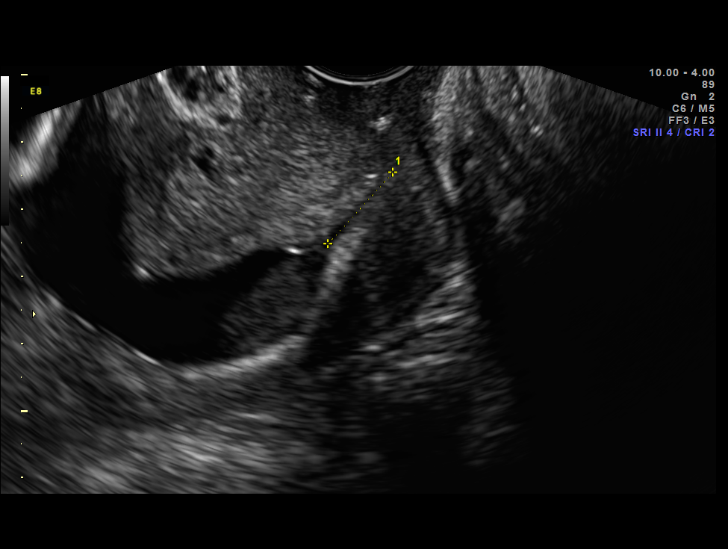
[im 24/33]
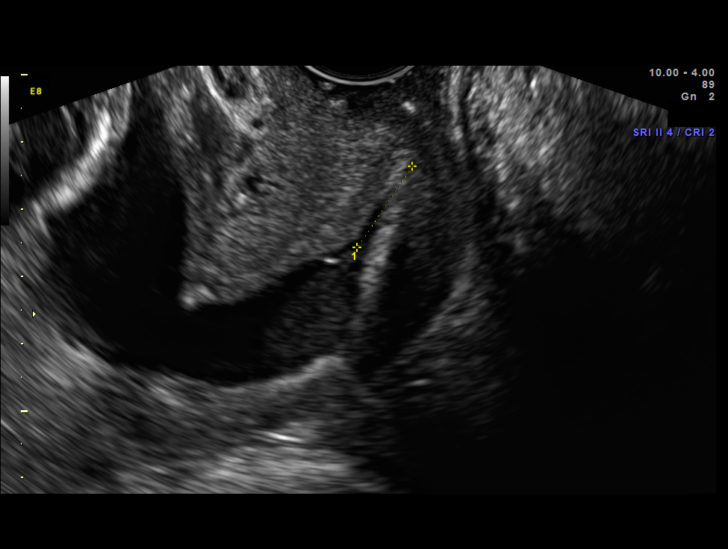
[im 27/33]
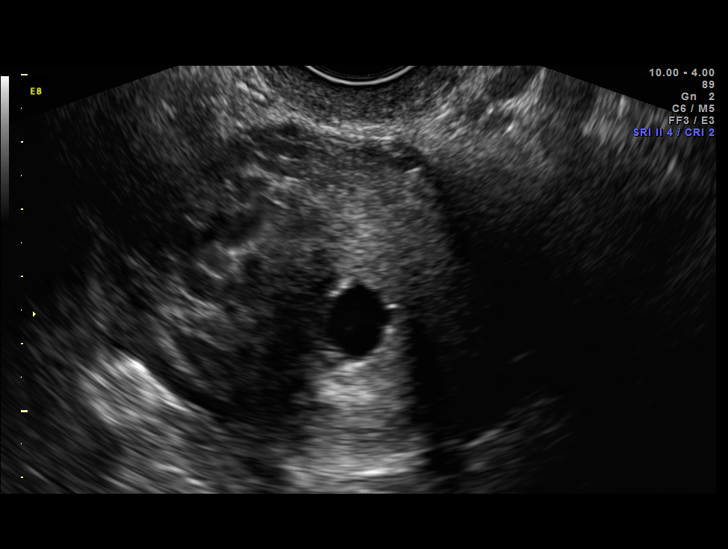
[im 29/33]
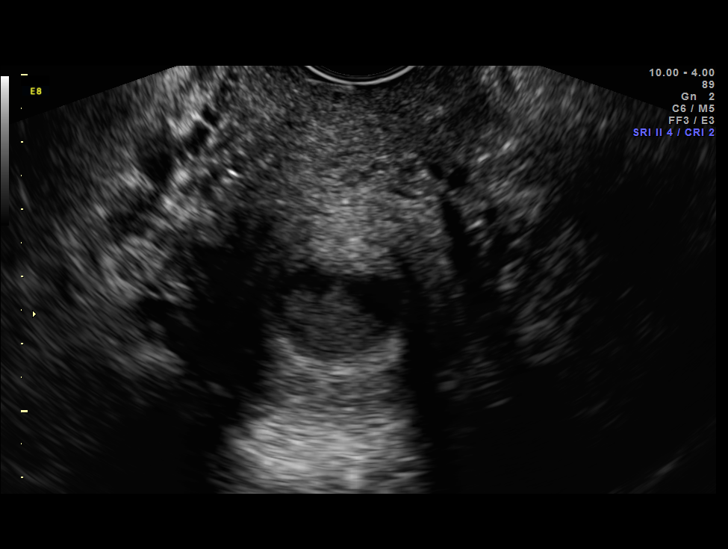
[im 31/33]
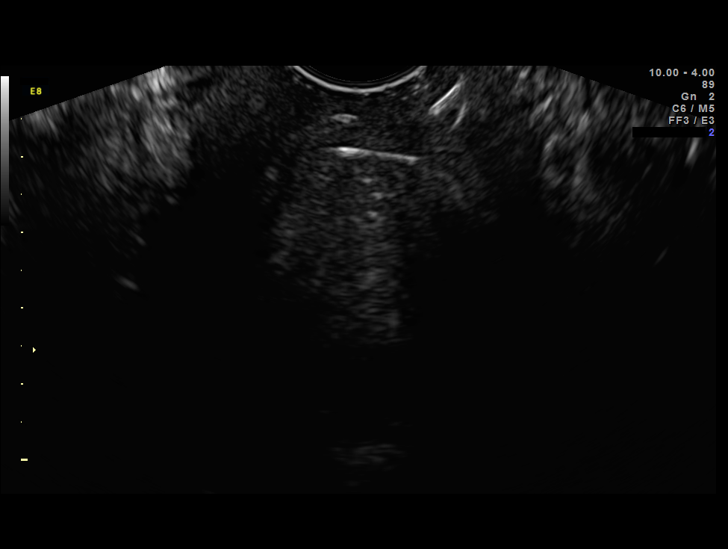

[13 of 28 positions shown; findings below may reference images not displayed]

OBSTETRICS REPORT
                      (Signed Final 07/12/2012 [DATE])

Service(s) Provided

 US OB TRANSVAGINAL                                    76817.0
Indications

 Poor obstetric history: Previous midtrimester loss
 (20 weeks)
 Cervical insufficiency ([facility]/[place] cerclage this
 pregnancy)
 Sickle cell trait
 Placenta previa/Low lying: No bleeding
Fetal Evaluation

 Num Of Fetuses:    1
 Fetal Heart Rate:  157                          bpm
 Cardiac Activity:  Observed
 Presentation:      Cephalic
 Placenta:          Posterior RT, low lying,
                    1.6 cm from os
 P. Cord            Marginal insertion
 Insertion:

 Amniotic Fluid
 AFI FV:      Subjectively within normal limits
Gestational Age

 LMP:           19w 6d        Date:  02/24/12                 EDD:   11/30/12
 Best:          19w 6d     Det. By:  LMP  (02/24/12)          EDD:   11/30/12
Anatomy

 Other:  Complete fetal anatomic survey previously performed in OB office.
Cervix Uterus Adnexa

 Cervical Length:    1.4      cm

 Cervix:       Appears funnelled, see comments
Impression

 IUP at 19+6 weeks
 Normal amniotic fluid volume
 Active fetus
 EV views of cervix: V-shaped funneling with closed distal
 portion measuring 1.4 cms; membranes prolapsing into
 proximal cervix; suture seen in the anterior fornix and
 adjacent to external os - could not visualize a circumferential
 suture in the cervix
 Digital exam: very posterior; 
 50 %, most of suture in vagina
 but attached to cerivx, knots anteriorly

 The US findings were shared with Ms. Frakim. We had a
 discussion regarding her options of expectant management
 with progesterone verses rescue cerclage. The risks and
 benefits of both were reviewed. After careful consideration,
 she wished to proceed with another cerclage. I talked with Dr.
 Knapp and plans will be made for surgery - here or
 possibly at [facility].
Recommendations

 Follow-up ultrasound after the cerclage to reassess CL and
 suture

 questions or concerns.

## 2014-04-28 ENCOUNTER — Encounter: Payer: Self-pay | Admitting: *Deleted

## 2014-07-02 ENCOUNTER — Encounter: Payer: Self-pay | Admitting: *Deleted

## 2014-10-14 ENCOUNTER — Telehealth: Payer: Self-pay | Admitting: Oncology

## 2014-10-14 NOTE — Telephone Encounter (Signed)
I attempted to called pt to set up new patient appt.  Unable to leave message.    Dx: Protein C Deficiency Referring: Dr. Cherly Hensen

## 2014-10-20 ENCOUNTER — Telehealth: Payer: Self-pay | Admitting: Hematology

## 2014-10-20 NOTE — Telephone Encounter (Signed)
Called patient a second time.  Unable to leave second message.

## 2014-10-30 ENCOUNTER — Encounter: Payer: Self-pay | Admitting: Hematology

## 2014-10-30 ENCOUNTER — Other Ambulatory Visit (HOSPITAL_BASED_OUTPATIENT_CLINIC_OR_DEPARTMENT_OTHER): Payer: BLUE CROSS/BLUE SHIELD

## 2014-10-30 ENCOUNTER — Ambulatory Visit (HOSPITAL_BASED_OUTPATIENT_CLINIC_OR_DEPARTMENT_OTHER): Payer: BLUE CROSS/BLUE SHIELD | Admitting: Hematology

## 2014-10-30 ENCOUNTER — Telehealth: Payer: Self-pay | Admitting: Hematology

## 2014-10-30 VITALS — BP 128/72 | HR 74 | Temp 98.4°F | Resp 18 | Ht 66.0 in | Wt 328.5 lb

## 2014-10-30 DIAGNOSIS — D6859 Other primary thrombophilia: Secondary | ICD-10-CM

## 2014-10-30 DIAGNOSIS — D689 Coagulation defect, unspecified: Secondary | ICD-10-CM

## 2014-10-30 DIAGNOSIS — N96 Recurrent pregnancy loss: Secondary | ICD-10-CM

## 2014-10-30 LAB — CBC & DIFF AND RETIC
BASO%: 0.2 % (ref 0.0–2.0)
Basophils Absolute: 0 10*3/uL (ref 0.0–0.1)
EOS ABS: 0.1 10*3/uL (ref 0.0–0.5)
EOS%: 1.9 % (ref 0.0–7.0)
HCT: 36.1 % (ref 34.8–46.6)
HEMOGLOBIN: 11.7 g/dL (ref 11.6–15.9)
IMMATURE RETIC FRACT: 6.8 % (ref 1.60–10.00)
LYMPH#: 2.1 10*3/uL (ref 0.9–3.3)
LYMPH%: 39.9 % (ref 14.0–49.7)
MCH: 29 pg (ref 25.1–34.0)
MCHC: 32.4 g/dL (ref 31.5–36.0)
MCV: 89.6 fL (ref 79.5–101.0)
MONO#: 0.5 10*3/uL (ref 0.1–0.9)
MONO%: 9.7 % (ref 0.0–14.0)
NEUT%: 48.3 % (ref 38.4–76.8)
NEUTROS ABS: 2.5 10*3/uL (ref 1.5–6.5)
Platelets: 290 10*3/uL (ref 145–400)
RBC: 4.03 10*6/uL (ref 3.70–5.45)
RDW: 13.6 % (ref 11.2–14.5)
RETIC %: 1.4 % (ref 0.70–2.10)
Retic Ct Abs: 56.42 10*3/uL (ref 33.70–90.70)
WBC: 5.3 10*3/uL (ref 3.9–10.3)

## 2014-10-30 LAB — COMPREHENSIVE METABOLIC PANEL (CC13)
ALBUMIN: 4 g/dL (ref 3.5–5.0)
ALK PHOS: 66 U/L (ref 40–150)
ALT: 17 U/L (ref 0–55)
ANION GAP: 7 meq/L (ref 3–11)
AST: 13 U/L (ref 5–34)
BILIRUBIN TOTAL: 0.31 mg/dL (ref 0.20–1.20)
BUN: 14 mg/dL (ref 7.0–26.0)
CO2: 26 mEq/L (ref 22–29)
Calcium: 9.6 mg/dL (ref 8.4–10.4)
Chloride: 109 mEq/L (ref 98–109)
Creatinine: 0.9 mg/dL (ref 0.6–1.1)
Glucose: 79 mg/dl (ref 70–140)
Potassium: 4 mEq/L (ref 3.5–5.1)
Sodium: 142 mEq/L (ref 136–145)
TOTAL PROTEIN: 7.4 g/dL (ref 6.4–8.3)

## 2014-10-30 NOTE — Telephone Encounter (Signed)
perp of to sch pt appt-sent pt back to lab-no appt sch at this time per pof

## 2014-11-03 LAB — D-DIMER, QUANTITATIVE: D-Dimer, Quant: 0.28 ug/mL-FEU (ref 0.00–0.48)

## 2014-11-03 LAB — VITAMIN B12: Vitamin B-12: 342 pg/mL (ref 211–911)

## 2014-11-03 LAB — APC RESISTANCE: APC RESISTANCE: 4.7 ratio (ref >=2.1)

## 2014-11-03 LAB — PROTEIN C, TOTAL AND FUNCTIONAL PANEL
PROTEIN C ANTIGEN: 92 % (ref 70–140)
Protein C Activity: 97 % (ref 70–180)

## 2014-11-03 LAB — FACTOR 8 ASSAY: Coagulation Factor VIII: 141 % (ref 50–180)

## 2014-11-03 LAB — HOMOCYSTEINE: Homocysteine: 7.4 umol/L (ref <10.4)

## 2014-11-04 ENCOUNTER — Other Ambulatory Visit: Payer: BLUE CROSS/BLUE SHIELD

## 2014-11-04 ENCOUNTER — Encounter: Payer: Self-pay | Admitting: Hematology

## 2014-11-04 NOTE — Progress Notes (Signed)
Contacted pt to introduce myself and ask if she has any financial questions/concerns. Pt states not at this time but she will contact me if she does.

## 2014-11-06 ENCOUNTER — Encounter: Payer: Self-pay | Admitting: Hematology

## 2014-11-06 NOTE — Progress Notes (Signed)
Marland Kitchen    HEMATOLOGY/ONCOLOGY CONSULTATION NOTE  Date of Service: 9/  Patient Care Team: Loleta Chance, MD as PCP - General  CHIEF COMPLAINTS/PURPOSE OF CONSULTATION:  Evaluation for ?Protein C deficiency  HISTORY OF PRESENTING ILLNESS:   Julie Trujillo is a wonderful 29 y.o. female who has been referred to [place] by Dr .Loleta Chance, MD and Dr Hoyle Barr [facility]) for evaluation and management of possible protein C deficiency.  Patient with history of subclinical hypothyroidism, polycystic ovarian syndrome, obesity and history of 2 second trimester miscarriages at 24 and 22 weeks thought to be related to cervical incompetence.she did have a laparoscopic abdominal clerclage placed during her 2nd pregnancy but unfortunately is still loss that pregnancy.  Patient was apparently worked up to rule out the hypercoagulable state.  She had an extensive workup that showed a negative factor V Leiden, negative prothrombin gene mutation, negative beta 2 glycoprotein, negative cardiolipin antibody.  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  Antithrombin III normal at 83%, protein S total 136, Protein C antigen was noted to be 67%borderline low with the normal range starting at 70%.  Patient has never had a history of venous thromboembolism despite being pregnant twice, having previous surgical procedures.  No family history of inherited thrombotic disorders or blood clots. No family history of excessive bleeding. No evidence that the placenta showed abnormal clots that might suggest hypercoagulable state.  Patient has obvious alternative explanation for her pregnancy losses- namely cervical incompetence.    MEDICAL HISTORY:  Past Medical History  Diagnosis Date  . Incompetent cervix     fetal demis at 22 wks- and one at 19 wks both delivered by svd  . PCOS (polycystic ovarian syndrome)   . Obesity   . Thyroid disease   . Sickle cell trait     as reported by patient  . Vitamin D  deficiency     SURGICAL HISTORY: Past Surgical History  Procedure Laterality Date  . Wisdom tooth extraction    . Cervical cerclage N/A 06/07/2012    Procedure: CERCLAGE CERVICAL;  Surgeon: Logan Bores, MD;  Location: [place] [facility];  Service: Gynecology;  Laterality: N/A;  1 hr OR time  . Hysteroscopy N/A 09/26/2012    Procedure:  DILATATION AND CURRETTAGE  HYSTEROSCOPY  with loa uterine Stent placement;  Surgeon: Governor Specking, MD;  Location: [place] [facility];  Service: Gynecology;  Laterality: N/A;  . Lysis of adhesion N/A 09/26/2012    Procedure: LYSIS OF ADHESION;  Surgeon: Governor Specking, MD;  Location: [place] [facility];  Service: Gynecology;  Laterality: N/A;  . Cerclage laparoscopic abdominal N/A 09/26/2012    Procedure: LAPAROSCOPIC TRANS ABDOMINAL CERCLAGE  with intraoperative ultrasound ;  Surgeon: Governor Specking, MD;  Location: [place] [facility];  Service: Gynecology;  Laterality: N/A;    SOCIAL HISTORY: Social History   Social History  . Marital Status: Single    Spouse Name: N/A  . Number of Children: N/A  . Years of Education: N/A   Occupational History  . Not on file.   Social History Main Topics  . Smoking status: Never Smoker   . Smokeless tobacco: Never Used  . Alcohol Use: No  . Drug Use: Yes    Special: Marijuana  . Sexual Activity: Yes    Birth Control/ Protection: Implant   Other Topics Concern  . Not on file   Social History Narrative   Works at [employer].    FAMILY HISTORY: Family History  Problem Relation Age of Onset  .  Colon cancer Mother 34  . Hypertension Mother   . Heart disease Mother   . Diabetes Maternal Aunt   . Thyroid disease Mother     ALLERGIES:  has No Known Allergies.  MEDICATIONS:  No current outpatient prescriptions on file.   No current facility-administered medications for this visit.    REVIEW OF SYSTEMS:    10 Point review of Systems was done is negative except as noted above.  PHYSICAL EXAMINATION: ECOG PERFORMANCE STATUS: 1 -  Symptomatic but completely ambulatory  . Filed Vitals:   10/30/14 1457  Height: 5' 6"  (1.676 m)  Weight: 328 lb 8 oz (149.007 kg)   Filed Weights   10/30/14 1457  Weight: 328 lb 8 oz (149.007 kg)   .Body mass index is 53.05 kg/(m^2).  GENERAL:alert, in no acute distress and comfortable SKIN: skin color, texture, turgor are normal, no rashes or significant lesions EYES: normal, conjunctiva are pink and non-injected, sclera clear OROPHARYNX:no exudate, no erythema and lips, buccal mucosa, and tongue normal  NECK: supple, no JVD, thyroid normal size, non-tender, without nodularity LYMPH:  no palpable lymphadenopathy in the cervical, axillary or inguinal LUNGS: clear to auscultation with normal respiratory effort HEART: regular rate & rhythm,  no murmurs and no lower extremity edema ABDOMEN: abdomen soft, non-tender, normoactive bowel sounds  Musculoskeletal: no cyanosis of digits and no clubbing  PSYCH: alert & oriented x 3 with fluent speech NEURO: no focal motor/sensory deficits  LABORATORY DATA:  I have reviewed the data as listed  . CBC Latest Ref Rng 10/30/2014 11/30/2013 07/09/2013  WBC 3.9 - 10.3 10e3/uL 5.3 4.7 6.1  Hemoglobin 11.6 - 15.9 g/dL 11.7 12.2 12.2  Hematocrit 34.8 - 46.6 % 36.1 37.1 37.6  Platelets 145 - 400 10e3/uL 290 330 321    . CMP Latest Ref Rng 10/30/2014 11/30/2013 07/30/2013  Glucose 70 - 140 mg/dl 79 83 83  BUN 7.0 - 26.0 mg/dL 14.0 11 14  Creatinine 0.6 - 1.1 mg/dL 0.9 0.86 0.92  Sodium 136 - 145 mEq/L 142 139 139  Potassium 3.5 - 5.1 mEq/L 4.0 4.0 4.0  Chloride 96 - 112 mEq/L - 103 104  CO2 22 - 29 mEq/L 26 24 28   Calcium 8.4 - 10.4 mg/dL 9.6 9.6 9.5  Total Protein 6.4 - 8.3 g/dL 7.4 7.8 -  Total Bilirubin 0.20 - 1.20 mg/dL 0.31 0.2(L) -  Alkaline Phos 40 - 150 U/L 66 62 -  AST 5 - 34 U/L 13 14 -  ALT 0 - 55 U/L 17 15 -    Component     Latest Ref Rng 10/30/2014  WBC     3.9 - 10.3 10e3/uL 5.3  NEUT#     1.5 - 6.5 10e3/uL 2.5   Hemoglobin     11.6 - 15.9 g/dL 11.7  HCT     34.8 - 46.6 % 36.1  Platelets     145 - 400 10e3/uL 290  MCV     79.5 - 101.0 fL 89.6  MCH     25.1 - 34.0 pg 29.0  MCHC     31.5 - 36.0 g/dL 32.4  RBC     3.70 - 5.45 10e6/uL 4.03  RDW     11.2 - 14.5 % 13.6  lymph#     0.9 - 3.3 10e3/uL 2.1  MONO#     0.1 - 0.9 10e3/uL 0.5  Eosinophils Absolute     0.0 - 0.5 10e3/uL 0.1  Basophils Absolute  0.0 - 0.1 10e3/uL 0.0  NEUT%     38.4 - 76.8 % 48.3  LYMPH%     14.0 - 49.7 % 39.9  MONO%     0.0 - 14.0 % 9.7  EOS%     0.0 - 7.0 % 1.9  BASO%     0.0 - 2.0 % 0.2  Retic %     0.70 - 2.10 % 1.40  Retic Ct Abs     33.70 - 90.70 10e3/uL 56.42  Immature Retic Fract     1.60 - 10.00 % 6.80  Sodium     136 - 145 mEq/L 142  Potassium     3.5 - 5.1 mEq/L 4.0  Chloride     98 - 109 mEq/L 109  CO2     22 - 29 mEq/L 26  Glucose     70 - 140 mg/dl 79  BUN     7.0 - 26.0 mg/dL 14.0  Creatinine     0.6 - 1.1 mg/dL 0.9  Total Bilirubin     0.20 - 1.20 mg/dL 0.31  Alkaline Phosphatase     40 - 150 U/L 66  AST     5 - 34 U/L 13  ALT     0 - 55 U/L 17  Total Protein     6.4 - 8.3 g/dL 7.4  Albumin     3.5 - 5.0 g/dL 4.0  Calcium     8.4 - 10.4 mg/dL 9.6  Anion gap     3 - 11 mEq/L 7  EGFR     >90 ml/min/1.73 m2 >90  Protein C Antigen     70 - 140 % 92  Protein C Activity     70 - 180 % 97  APC Resistance     >=2.1 ratio 4.7  Vitamin B-12     211 - 911 pg/mL 342  Homocysteine     <10.4 umol/L 7.4  D-Dimer, Quant     0.00 - 0.48 ug/mL-FEU 0.28  Coagulation Factor VIII     50 - 180 % 141   RADIOGRAPHIC STUDIES: I have personally reviewed the radiological images as listed and agreed with the findings in the report. No results found.  ASSESSMENT & PLAN:   Ms Evellyn Tuff is a 29 yo female referral for evaluation of  1) Possible Protein C deficiency noted on workup for 2nd trimester pregnancy losses. Patient has never had a personal history of venous  thromboembolism and does not have a family history of venous thromboembolism or arterial thromboembolism. There was no evidence that her placenta has shown signs of abnormal clotting.  Often in the setting of significant hereditary thrombophilias often the pregnancy is lost in the 1st trimesteral  Plan -Patient's outside lab showed a protein C antigen 67 which is borderline.beats recheck the protein C levels to make sure it was a not a case of physiologic variation as might happen with an infection and liver dysfunction. -repeat labs in our clinic demonstrated normal protein C antigen and activity levels ruling out protein C deficiency. Also patient's personal or family history are not suggestive of a hereditary thrombophilia. -Her history and findings also  Do not clearly suggest a hypercoagulable state as an etiology of her pregnancy losses. No specific recommendations with regards to the use of blood thinners subsequent pregnancies. Return to care with her primary care physician and OB/GYN doctors  Hematology followup with me as needed if new questions or concerns arise All of  the patients questions were answered to her apparent satisfaction. The patient knows to call the clinic with any problems, questions or concerns.  I spent 40 minutes counseling the patient face to face. The total time spent in the appointment was 55 minutes and more than 50% was on counseling and direct patient cares.    Sullivan Lone MD [place] AAHIVMS [facility] Hematology/Oncology Physician [facility]  (Office):       [phone] (Work cell):  [phone] (Fax):           [phone]  11/06/2014 12:52 AM

## 2014-11-10 ENCOUNTER — Other Ambulatory Visit: Payer: BLUE CROSS/BLUE SHIELD

## 2014-11-10 ENCOUNTER — Ambulatory Visit: Payer: No Typology Code available for payment source | Admitting: Internal Medicine

## 2014-11-10 ENCOUNTER — Encounter: Payer: Self-pay | Admitting: Internal Medicine

## 2014-11-14 ENCOUNTER — Ambulatory Visit: Payer: BLUE CROSS/BLUE SHIELD | Admitting: Nurse Practitioner

## 2014-11-14 ENCOUNTER — Other Ambulatory Visit: Payer: BLUE CROSS/BLUE SHIELD

## 2014-12-27 ENCOUNTER — Inpatient Hospital Stay (HOSPITAL_COMMUNITY)
Admission: AD | Admit: 2014-12-27 | Discharge: 2014-12-27 | Disposition: A | Discharge status: Home / Self Care | Payer: BLUE CROSS/BLUE SHIELD | Source: Ambulatory Visit | Attending: Obstetrics and Gynecology | Admitting: Obstetrics and Gynecology

## 2014-12-27 ENCOUNTER — Encounter (HOSPITAL_COMMUNITY): Payer: Self-pay

## 2014-12-27 DIAGNOSIS — R112 Nausea with vomiting, unspecified: Secondary | ICD-10-CM | POA: Diagnosis present

## 2014-12-27 DIAGNOSIS — Z3202 Encounter for pregnancy test, result negative: Secondary | ICD-10-CM | POA: Insufficient documentation

## 2014-12-27 LAB — URINALYSIS, ROUTINE W REFLEX MICROSCOPIC
Glucose, UA: NEGATIVE mg/dL
Hgb urine dipstick: NEGATIVE
Ketones, ur: NEGATIVE mg/dL
LEUKOCYTES UA: NEGATIVE
NITRITE: NEGATIVE
PROTEIN: NEGATIVE mg/dL
UROBILINOGEN UA: 0.2 mg/dL (ref 0.0–1.0)
pH: 5.5 (ref 5.0–8.0)

## 2014-12-27 LAB — POCT PREGNANCY, URINE: PREG TEST UR: NEGATIVE

## 2014-12-27 MED ORDER — PROMETHAZINE HCL 12.5 MG PO TABS: 12.5000 mg | ORAL_TABLET | Freq: Four times a day (QID) | ORAL | Status: AC | PRN

## 2014-12-27 NOTE — MAU Provider Note (Signed)
History     CSN: [id number]  Arrival date and time: 12/27/14 0039  Provider notified: 0100 & 0208 Provider on unit: 0250 Provider at bedside: 0255     Chief Complaint  Patient presents with  . Nausea   HPI  Ms. Julie Trujillo is a 29 yo G45P0200 female presenting with complaints of nausea x 2 weeks and vomiting x 1 week.  She reports vomiting QOD.  She reports 2 negative UPT (1 last week and 1 this morning). Her LMP was 11/08/2014.  She is currently being treated for thyroid disease and PCOS by Dr. Cherly Hensen. She was advised to f/u with Dr. Cherly Hensen in 2 weeks, but she "does not feel comfortable waiting that long". She has a h/o a fetal demise at 22 wks and one a 19 wks.  Past Medical History  Diagnosis Date  . Incompetent cervix     fetal demis at 22 wks- and one at 19 wks both delivered by svd  . PCOS (polycystic ovarian syndrome)   . Obesity   . Thyroid disease   . Sickle cell trait (HCC)     as reported by patient  . Vitamin D deficiency     Past Surgical History  Procedure Laterality Date  . Wisdom tooth extraction    . Cervical cerclage N/A 06/07/2012    Procedure: CERCLAGE CERVICAL;  Surgeon: Oliver Pila, MD;  Location: [facility];  Service: Gynecology;  Laterality: N/A;  1 hr OR time  . Hysteroscopy N/A 09/26/2012    Procedure:  DILATATION AND CURRETTAGE  HYSTEROSCOPY  with loa uterine Stent placement;  Surgeon: Fermin Schwab, MD;  Location: [facility];  Service: Gynecology;  Laterality: N/A;  . Lysis of adhesion N/A 09/26/2012    Procedure: LYSIS OF ADHESION;  Surgeon: Fermin Schwab, MD;  Location: [facility];  Service: Gynecology;  Laterality: N/A;  . Cerclage laparoscopic abdominal N/A 09/26/2012    Procedure: LAPAROSCOPIC TRANS ABDOMINAL CERCLAGE  with intraoperative ultrasound ;  Surgeon: Fermin Schwab, MD;  Location: [facility];  Service: Gynecology;  Laterality: N/A;    Family History  Problem Relation Age of Onset  . Colon cancer Mother 46  . Hypertension Mother   .  Heart disease Mother   . Thyroid disease Mother   . Diabetes Maternal Aunt     Social History  Substance Use Topics  . Smoking status: Never Smoker   . Smokeless tobacco: Never Used  . Alcohol Use: No    Allergies: No Known Allergies  Prescriptions prior to admission  Medication Sig Dispense Refill Last Dose  . levothyroxine (SYNTHROID, LEVOTHROID) 100 MCG tablet Take 100 mcg by mouth daily before breakfast.   12/26/2014 at Unknown time  . metFORMIN (GLUCOPHAGE) 1000 MG tablet Take 500 mg by mouth 2 (two) times daily. Has 1000 mg tab, per MD- Tab cut in half, 1st half in morning and other half at night.   12/26/2014 at Unknown time    Review of Systems  Constitutional: Negative.   HENT: Negative.   Eyes: Negative.   Respiratory: Negative.   Cardiovascular: Negative.   Gastrointestinal: Positive for nausea and vomiting.  Genitourinary: Negative.   Musculoskeletal: Positive for back pain.  Skin: Negative.   Neurological: Negative.   Endo/Heme/Allergies: Negative.   Psychiatric/Behavioral: Negative.    Results for orders placed or performed during the hospital encounter of 12/27/14 (from the past 24 hour(s))  Urinalysis, Routine w reflex microscopic (not at [facility])     Status: Abnormal   Collection Time:  12/27/14 12:50 AM  Result Value Ref Range   Color, Urine YELLOW YELLOW   APPearance CLEAR CLEAR   Specific Gravity, Urine >1.030 (H) 1.005 - 1.030   pH 5.5 5.0 - 8.0   Glucose, UA NEGATIVE NEGATIVE mg/dL   Hgb urine dipstick NEGATIVE NEGATIVE   Bilirubin Urine SMALL (A) NEGATIVE   Ketones, ur NEGATIVE NEGATIVE mg/dL   Protein, ur NEGATIVE NEGATIVE mg/dL   Urobilinogen, UA 0.2 0.0 - 1.0 mg/dL   Nitrite NEGATIVE NEGATIVE   Leukocytes, UA NEGATIVE NEGATIVE  Pregnancy, urine POC     Status: None   Collection Time: 12/27/14  1:01 AM  Result Value Ref Range   Preg Test, Ur NEGATIVE NEGATIVE   Physical Exam   Blood pressure 116/81, pulse 70, temperature 99.1 F (37.3 C),  temperature source Oral, resp. rate 16, height 5' 6.5" (1.689 m), weight 144.357 kg (318 lb 4 oz), last menstrual period 11/08/2014.  Physical Exam  Constitutional: She is oriented to person, place, and time. She appears well-developed and well-nourished.  Morbid Obesity  HENT:  Head: Normocephalic and atraumatic.  Eyes: Pupils are equal, round, and reactive to light.  Neck: Normal range of motion.  Cardiovascular: Normal rate and regular rhythm.   Respiratory: Effort normal.  GI: Soft.  Genitourinary:  deferred  Musculoskeletal: Normal range of motion.  Neurological: She is alert and oriented to person, place, and time. She has normal reflexes.  Skin: Skin is warm and dry.  Psychiatric: She has a normal mood and affect. Her behavior is normal. Judgment and thought content normal.    MAU Course  Procedures CCUA UPT Assessment and Plan  Nausea and Vomiting   Discharge Home  Rx given for Phenergan 12.5 mg every 6 hours prn N/V Keep F/U appt with Dr. Cherly Hensenousins in 2 wks  Kenard GowerAWSON, Annice Jolly, M MSN, CNM 12/27/2014, 3:04 AM

## 2014-12-27 NOTE — Discharge Instructions (Signed)

## 2014-12-27 NOTE — MAU Note (Addendum)
Every other day, feels nauseated x 2 weeks.  Vomited yesterday (Thursday - twice) and on Friday x 1 episode.  Light headed . Lower back pain once in a while over the past few days.  No bleeding.   LMP was Sept 17th.Julie Trujillo.  Took home pregnancy test this morning and last week and both negative.  Is trying to get pregnant.  (Hx of delivery at approx 23-24 weeks with last pregnancy- had cerclage and delivered at 21 weeks with first pregnancy)

## 2015-01-05 ENCOUNTER — Encounter: Payer: No Typology Code available for payment source | Admitting: Internal Medicine
